# Patient Record
Sex: Male | Born: 1950 | Race: White | Hispanic: No | Marital: Married | Smoking: Former smoker
Health system: Southern US, Community
[De-identification: ages and names within clinical notes are randomized; demographics above are authoritative.]

## PROBLEM LIST (undated history)

## (undated) DIAGNOSIS — R37 Sexual dysfunction, unspecified: Secondary | ICD-10-CM

## (undated) DIAGNOSIS — J449 Chronic obstructive pulmonary disease, unspecified: Secondary | ICD-10-CM

## (undated) DIAGNOSIS — Z9981 Dependence on supplemental oxygen: Secondary | ICD-10-CM

## (undated) DIAGNOSIS — R067 Sneezing: Secondary | ICD-10-CM

## (undated) DIAGNOSIS — T8182XA Emphysema (subcutaneous) resulting from a procedure, initial encounter: Secondary | ICD-10-CM

## (undated) DIAGNOSIS — J439 Emphysema, unspecified: Secondary | ICD-10-CM

## (undated) DIAGNOSIS — I2510 Atherosclerotic heart disease of native coronary artery without angina pectoris: Secondary | ICD-10-CM

## (undated) DIAGNOSIS — J9610 Chronic respiratory failure, unspecified whether with hypoxia or hypercapnia: Secondary | ICD-10-CM

## (undated) DIAGNOSIS — I447 Left bundle-branch block, unspecified: Secondary | ICD-10-CM

## (undated) DIAGNOSIS — Z9189 Other specified personal risk factors, not elsewhere classified: Secondary | ICD-10-CM

## (undated) HISTORY — DX: Other specified personal risk factors, not elsewhere classified: Z91.89

## (undated) HISTORY — DX: Emphysema (subcutaneous) resulting from a procedure, initial encounter: T81.82XA

## (undated) HISTORY — DX: Emphysema, unspecified: J43.9

## (undated) HISTORY — DX: Dependence on supplemental oxygen: Z99.81

## (undated) HISTORY — DX: Atherosclerotic heart disease of native coronary artery without angina pectoris: I25.10

## (undated) HISTORY — DX: Chronic obstructive pulmonary disease, unspecified: J44.9

## (undated) HISTORY — DX: Sneezing: R06.7

## (undated) HISTORY — DX: Sexual dysfunction, unspecified: R37

## (undated) HISTORY — DX: Chronic respiratory failure, unspecified whether with hypoxia or hypercapnia: J96.10

## (undated) HISTORY — DX: Left bundle-branch block, unspecified: I44.7

## (undated) HISTORY — PX: APPENDECTOMY: SHX54

---

## 2001-02-24 ENCOUNTER — Encounter: Payer: Self-pay | Admitting: Critical Care Medicine

## 2006-02-24 ENCOUNTER — Ambulatory Visit: Payer: Self-pay | Admitting: Cardiology

## 2006-05-18 ENCOUNTER — Ambulatory Visit: Payer: Self-pay | Admitting: Critical Care Medicine

## 2006-07-04 ENCOUNTER — Ambulatory Visit: Admission: RE | Admit: 2006-07-04 | Discharge: 2006-07-04 | Payer: Self-pay | Admitting: Critical Care Medicine

## 2006-07-05 ENCOUNTER — Encounter: Payer: Self-pay | Admitting: Critical Care Medicine

## 2006-07-06 ENCOUNTER — Ambulatory Visit: Payer: Self-pay | Admitting: Critical Care Medicine

## 2006-07-27 ENCOUNTER — Ambulatory Visit: Payer: Self-pay | Admitting: Critical Care Medicine

## 2006-08-02 ENCOUNTER — Ambulatory Visit: Payer: Self-pay | Admitting: Critical Care Medicine

## 2006-08-16 ENCOUNTER — Ambulatory Visit: Payer: Self-pay | Admitting: Critical Care Medicine

## 2006-12-04 ENCOUNTER — Ambulatory Visit: Payer: Self-pay | Admitting: Critical Care Medicine

## 2006-12-05 DIAGNOSIS — J449 Chronic obstructive pulmonary disease, unspecified: Secondary | ICD-10-CM

## 2007-05-29 ENCOUNTER — Ambulatory Visit: Payer: Self-pay | Admitting: Critical Care Medicine

## 2007-12-03 ENCOUNTER — Ambulatory Visit: Payer: Self-pay | Admitting: Critical Care Medicine

## 2008-06-30 ENCOUNTER — Ambulatory Visit: Payer: Self-pay | Admitting: Critical Care Medicine

## 2009-01-12 ENCOUNTER — Ambulatory Visit: Payer: Self-pay | Admitting: Critical Care Medicine

## 2009-08-03 ENCOUNTER — Ambulatory Visit: Payer: Self-pay | Admitting: Critical Care Medicine

## 2009-08-18 ENCOUNTER — Telehealth (INDEPENDENT_AMBULATORY_CARE_PROVIDER_SITE_OTHER): Payer: Self-pay | Admitting: *Deleted

## 2009-10-12 ENCOUNTER — Telehealth: Payer: Self-pay | Admitting: Critical Care Medicine

## 2010-01-25 ENCOUNTER — Telehealth (INDEPENDENT_AMBULATORY_CARE_PROVIDER_SITE_OTHER): Payer: Self-pay | Admitting: *Deleted

## 2010-01-25 ENCOUNTER — Ambulatory Visit: Payer: Self-pay | Admitting: Critical Care Medicine

## 2010-02-16 ENCOUNTER — Ambulatory Visit: Payer: Self-pay | Admitting: Critical Care Medicine

## 2010-04-13 NOTE — Assessment & Plan Note (Signed)
Summary: breathing problem/ mbw   Primary Provider/Referring Provider:  none  CC:  Follow up, pt was given Spiriva by Dr. Delford Field , took for 2 months stated it helped but caused dry mouth and too expensive for him to buy, and started back on qvar but has increase sob would like other options for inhaler.  History of Present Illness: Pulmonary OV  This is  a 60 year old, white male with asthmatic bronchitis, chronic obstructive lung disease.     Aug 03, 2009 9:35 AM No real cough,  no real change in dyspnea.   Overall doing ok.  Is only on Qvar daily.  No excess mucous.  No chest pain.  No wheeze.   January 25, 2010--Presents for follow up. Last visit was started on Spiriva however did not take for long. Complains that he could not tolerate due lots of side effects w/ dry mouth, restless, also was very expensive.  We talked about several options,He wants to keep cost down. Says he felt better with he breathing on Spiriva however did not lke the side effects or cost. Talked with him about  Atrovent and he would like to try this. Denies chest pain,  orthopnea, hemoptysis, fever, n/v/d, edema, headache.   Preventive Screening-Counseling & Management  Alcohol-Tobacco     Smoking Status: quit > 6 months     Packs/Day: 2.0     Year Started: 1968     Year Quit: Jan 2008     Pack years: 66  Current Medications (verified): 1)  Adult Aspirin Low Strength 81 Mg  Tbdp (Aspirin) .... One By Mouth Once Daily 2)  Qvar 40 Mcg/act Aers (Beclomethasone Dipropionate) .... Inhale 2 Puffs Two Times A Day  Allergies (verified): 1)  ! 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 Delford Field , took for 2 months stated it helped but caused dry mouth and too expensive for him to buy, started back on qvar but has increase sob would like other options for inhaler Comments Medications reviewed with patient Renold Genta RCP, LPN  January 25, 2010 11:30 AM    Physical Exam  Additional Exam:  Gen: Pleasant, well nourished, in no distress ENT: no lesions, no postnasal drip Neck: No JVD, no TMG, no carotid bruits Lungs: no use of accessory muscles, no dullness to percussion, clear without rales or rhonchi Cardiovascular: RRR, heart sounds normal, no murmurs or gallops, no peripheral edema Musculoskeletal: No deformities, no cyanosis or clubbing     Impression & Recommendations:  Problem # 1:  COPD (ICD-496) Trial  of Atrovent  Plan Finish QVAR inhaler Begin Atrovent 2 puffs four times a day , brush, rinse and gargle after all inhaler use.  follow up Dr. Delford Field in 3 weeks   Medications Added to Medication List This Visit: 1)  Atrovent Hfa 17 Mcg/act Aers (Ipratropium bromide hfa) .... 2 puffs four times a day  Complete Medication List: 1)  Adult Aspirin Low Strength 81 Mg Tbdp (Aspirin) .... One by mouth once daily 2)  Atrovent Hfa 17 Mcg/act Aers (Ipratropium bromide hfa) .... 2 puffs four times a day  Other Orders: Est. Patient Level III (21308)  Patient Instructions: 1)  Finish QVAR inhaler 2)  Begin Atrovent 2 puffs four times a day , brush, rinse and gargle after all inhaler use.  3)  follow up Dr. Delford Field in 3 weeks  Prescriptions: ATROVENT HFA 17 MCG/ACT AERS (IPRATROPIUM  BROMIDE HFA) 2 puffs four times a day  #1 x 5   Entered and Authorized by:   Rubye Oaks NP   Signed by:   Tammy Parrett NP on 01/25/2010   Method used:   Print then Give to Patient   RxID:   [id number]

## 2010-04-13 NOTE — Progress Notes (Signed)
Summary: NEEDS 3 WK F/U   Phone Note Call from Patient Call back at [facility] Phone [phone]   Caller: Patient Call For: WRIGHT Summary of Call: NEEDS 3 WK F/U W/ DR WRIGHT. I GAVE THEM 12/5 APPT BUT PT CANCELLED AS HIS SPOUSE HAS AN APPT THAT DAY. CALL HOME # ABOVE OR [phone] Initial call taken by: Tivis Ringer, CNA,  January 25, 2010 12:20 PM  Follow-up for Phone Call        ATC pt's home number _ [facility] Gweneth Dimitri RN  January 25, 2010 5:24 PM  Wife, Asher Muir, returning call. Lehman Prom  January 26, 2010 8:33 AM  Additional Follow-up for Phone Call Additional follow up Details #1::         [facility] pt's home number -[email or web address].  Called alternate number listed above but was busy - looks like 3 wk f/u on Dec 5 was not canceled.  ? if they are wanting to reschedule this.  If so, openings on Dec 6.  Gweneth Dimitri RN  January 26, 2010 11:13 AM  Returning  call, can be reached at [phone] or [phone].Darletta Moll  January 26, 2010 11:27 AM      Additional Follow-up for Phone Call Additional follow up Details #2::    Spoke with pt's wife.  She states all they need to do is cancel the appt on dec 5 and reschedule this.  Dec 5 appt was cancelled and rescheduled for Dec 6 at 9am with PW.  Asher Muir is aware. Follow-up by: Gweneth Dimitri RN,  January 26, 2010 12:09 PM

## 2010-04-13 NOTE — Assessment & Plan Note (Signed)
Summary: Pulmonary OV   Primary Provider/Referring Provider:  none  CC:  3 wk followup.  Pt states that since starting atrovent his breathing has been "okay"- but not doing as well as when he was on spiriva.  He states no complaints today.Russell Schaefer  History of Present Illness: Pulmonary OV  This is  a 60 year old, white male with asthmatic bronchitis, chronic obstructive lung disease.     Aug 03, 2009 9:35 AM No real cough,  no real change in dyspnea.   Overall doing ok.  Is only on Qvar daily.  No excess mucous.  No chest pain.  No wheeze.   January 25, 2010--Presents for follow up. Last visit was started on Spiriva however did not take for long. Complains that he could not tolerate due lots of side effects w/ dry mouth, restless, also was very expensive.  We talked about several options,He wants to keep cost down. Says he felt better with he breathing on Spiriva however did not lke the side effects or cost. Talked with him about  Atrovent and he would like to try this. Denies chest pain,  orthopnea, hemoptysis, fever, n/v/d, edema, headache.   February 16, 2010 9:06 AM Pt seen per NP and changed from spiriva>>>qvar>>>atrovent for $$$ and side effects issues. Had insomnia with spiriva.  Dry mouth was the issues.  The pt notes atrovent does not dry out the mouth. cough is minimal.  Pt is worse on damp days. overall atrovent seemed to help.   Clinical Reports Reviewed:  PFT's:  08/03/2009: FEV1 %Predicted:  49.40 FEV1/FVC %Predicted:  65.90 FVC %Predicted:  75.10  05/29/2007: DLCO %Predicted:  54 FEF 25/75 %Predicted:  17 FEV1 %Predicted:  47 FVC %Predicted:  81 Post Spirometry FEF 25/75 %Predicted:  15 Post Spirometry FEV1 %Predicted:  49 Post Spirometry FVC %Predicted:  90 RV %Predicted:  145 TLC %Predicted:  104   Current Medications (verified): 1)  Adult Aspirin Low Strength 81 Mg  Tbdp (Aspirin) .... One By Mouth Once Daily 2)  Atrovent Hfa 17 Mcg/act Aers (Ipratropium  Bromide Hfa) .... 2 Puffs Four Times A Day  Allergies (verified): 1)  ! * Spiriva  Past History:  Past medical, surgical, family and social histories (including risk factors) reviewed, and no changes noted (except as noted below).  Past Medical History: Reviewed history from 12/05/2006 and no changes required. COPD  Past Pulmonary History:  Pulmonary History: COPD  Golds Stage II  PFTS 2008: FeV1 47%  FeV1/FVC 46%  TLC 102% DLCO 54%    Family History: Reviewed history from 05/29/2007 and no changes required. Family History COPD   Social History: Reviewed history from 05/29/2007 and no changes required. 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  The patient denies shortness of breath at rest, productive cough, non-productive cough, coughing up blood, chest pain, irregular heartbeats, acid heartburn, indigestion, loss of appetite, weight change, abdominal pain, difficulty swallowing, sore throat, tooth/dental problems, headaches, nasal congestion/difficulty breathing through nose, sneezing, itching, ear ache, anxiety, depression, hand/feet swelling, joint stiffness or pain, rash, change in color of mucus, and fever.    Vital Signs:  Patient profile:   60 year old male Weight:      162.25 pounds O2 Sat:      90 % on Room air Temp:     97.5 degrees F oral Pulse rate:   70 / minute  BP sitting:   118 / 80  (left arm)  Vitals Entered By: Vernie Murders (February 16, 2010 8:59 AM)  O2 Flow:  Room air  O2 Sat Comments when the pt arrived o2 sat was 86% RA- this increased to 90% RA after resting  Physical Exam  Additional Exam:  Gen: Pleasant, well nourished, in no distress ENT: no lesions, no postnasal drip Neck: No JVD, no TMG, no carotid bruits Lungs: no use of accessory muscles, no dullness to percussion, clear without rales or rhonchi Cardiovascular: RRR, heart sounds  normal, no murmurs or gallops, no peripheral edema Musculoskeletal: No deformities, no cyanosis or clubbing     Impression & Recommendations:  Problem # 1:  COPD (ICD-496) Assessment Improved golds stage II copd , side effects with spiriva plan dc spiriva cont atrovent qid  dc qvar   Complete Medication List: 1)  Adult Aspirin Low Strength 81 Mg Tbdp (Aspirin) .... One by mouth once daily 2)  Atrovent Hfa 17 Mcg/act Aers (Ipratropium bromide hfa) .... 2 puffs four times a day  Other Orders: Est. Patient Level III (78469)  Patient Instructions: 1)  Stay on Atrovent for now 2)  Return 4-5 months   Contraindications/Deferment of Procedures/Staging:    Test/Procedure: FLU VAX    Reason for deferment: patient declined   Prevention & Chronic Care Immunizations   Influenza vaccine: Not documented   Influenza vaccine deferral: patient declined  (02/16/2010)    Tetanus booster: Not documented    Pneumococcal vaccine: Not documented  Colorectal Screening   Hemoccult: Not documented    Colonoscopy: Not documented  Other Screening   PSA: Not documented   Smoking status: quit > 6 months  (01/25/2010)  Lipids   Total Cholesterol: Not documented   LDL: Not documented   LDL Direct: Not documented   HDL: Not documented   Triglycerides: Not documented

## 2010-04-13 NOTE — Assessment & Plan Note (Signed)
Summary: Pulmonary OV   Primary Provider/Referring Provider:  none  CC:  6 month followup.  Pt states that breathing is the same- no better or worse.  He denies any new complaints today.Marland Kitchen  History of Present Illness: Pulmonary OV  This is  a 60 year old, white male with asthmatic bronchitis, chronic obstructive lung disease.     Aug 03, 2009 9:35 AM No real cough,  no real change in dyspnea.   Overall doing ok.  Is only on Qvar daily.  No excess mucous.  No chest pain.  No wheeze. Pt denies any significant sore throat, nasal congestion or excess secretions, fever, chills, sweats, unintended weight loss, pleurtic or exertional chest pain, orthopnea PND, or leg swelling Pt denies any increase in rescue therapy over baseline, denies waking up needing it or having any early am or nocturnal exacerbations of coughing/wheezing/or dyspnea. Pt also denies any obvious fluctuation in symptoms wiht weather or environmental change or other alleviating or aggravating factors    Preventive Screening-Counseling & Management  Alcohol-Tobacco     Smoking Status: quit > 6 months     Year Quit: Jan 2008  Current Medications (verified): 1)  Adult Aspirin Low Strength 81 Mg  Tbdp (Aspirin) .... One By Mouth Once Daily 2)  Qvar 40 Mcg/act Aers (Beclomethasone Dipropionate) .... Two Puffs Once Daily  Allergies (verified): No Known Drug Allergies  Past History:  Past medical, surgical, family and social histories (including risk factors) reviewed, and no changes noted (except as noted below).  Past Medical History: Reviewed history from 12/05/2006 and no changes required. COPD  Past Pulmonary History:  Pulmonary History: COPD  Golds Stage II  PFTS 2008: FeV1 47%  FeV1/FVC 46%  TLC 102% DLCO 54%    Family History: Reviewed history from 05/29/2007 and no changes required. Family History COPD   Social History: Reviewed history from 05/29/2007 and no changes required. 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  The patient denies shortness of breath at rest, productive cough, non-productive cough, coughing up blood, chest pain, irregular heartbeats, acid heartburn, indigestion, loss of appetite, weight change, abdominal pain, difficulty swallowing, sore throat, tooth/dental problems, headaches, nasal congestion/difficulty breathing through nose, sneezing, itching, ear ache, anxiety, depression, hand/feet swelling, joint stiffness or pain, rash, change in color of mucus, and fever.    Vital Signs:  Patient profile:   60 year old male Weight:      160 pounds O2 Sat:      93 % on Room air Temp:     97.7 degrees F oral Pulse rate:   76 / minute BP sitting:   126 / 82  (left arm)  Vitals Entered By: Vernie Murders (Aug 03, 2009 9:30 AM)  O2 Flow:  Room air  Physical Exam  Additional Exam:  Gen: Pleasant, well nourished, in no distress ENT: no lesions, no postnasal drip Neck: No JVD, no TMG, no carotid bruits Lungs: no use of accessory muscles, no dullness to percussion, clear without rales or rhonchi Cardiovascular: RRR, heart sounds normal, no murmurs or gallops, no peripheral edema Musculoskeletal: No deformities, no cyanosis or clubbing     Pulmonary Function Test Date: 08/03/2009 09:44 AM Gender: Male  Pre-Spirometry FVC    Value: 3.47 L/min   % Pred: 75.10 % FEV1    Value: 1.73 L     Pred: 3.50 L     %  Pred: 49.40 % FEV1/FVC  Value: 49.97 %     % Pred: 65.90 %  Impression & Recommendations:  Problem # 1:  COPD (ICD-496) Assessment Unchanged  Chronic obstructive lung disease, stable at this time. No change in airflow obstruction on spirometry today 5/23  golds stage II  Plan d/c qvar start spiriva daily  Medications Added to Medication List This Visit: 1)  Spiriva Handihaler 18 Mcg Caps (Tiotropium bromide monohydrate) .... Two puffs in handihaler  daily  Complete Medication List: 1)  Adult Aspirin Low Strength 81 Mg Tbdp (Aspirin) .... One by mouth once daily 2)  Spiriva Handihaler 18 Mcg Caps (Tiotropium bromide monohydrate) .... Two puffs in handihaler daily  Other Orders: Spirometry w/Graph (94010) Est. Patient Level IV (18841) Prescription Created Electronically [phone]) HFA Instruction [phone])  Patient Instructions: 1)  Stop Qvar 2)  Start Spiriva ,  one capsule, two puffs daily, use sample, refills sent to your pharmacy 3)  Return 6 months, sooner if unimproved or worse Prescriptions: SPIRIVA HANDIHALER 18 MCG  CAPS (TIOTROPIUM BROMIDE MONOHYDRATE) Two puffs in handihaler daily Brand medically necessary #30 x 6   Entered and Authorized by:   Storm Frisk MD   Signed by:   Storm Frisk MD on 08/03/2009   Method used:   Electronically to        [facility]* (retail)       [address]       [place], [place]  [id number]       Ph: [phone]       Fax: [phone]   RxID:   [id number]    CardioPerfect Spirometry  ID: [id number] Patient: MATSON, WELCH DOB: [birth year] Age: 60 Years Old Sex: Male Race: White Height: 69 Weight: 160 Status: Confirmed Past Medical History:  COPD  Recorded: 08/03/2009 09:44 AM  Parameter  Measured Predicted %Predicted FVC     3.47        4.62        75.10 FEV1     1.73        3.50        49.40 FEV1%   49.97        75.88        65.90 PEF    5.07        9.05        56   Comments: Severe obstructive defect, little change from 2008.   Interpretation: Pre: FVC= 3.47L FEV1= 1.73L FEV1%= 50.0% 1.73/3.47 FEV1/FVC (08/03/2009 9:49:01 AM), Severe obstruction.Mild restriction

## 2010-04-13 NOTE — Progress Notes (Signed)
Summary: samples  Phone Note Call from Patient Call back at Home Phone [phone]   Caller: Patient Call For: wright Summary of Call: pt states that dr Delford Field gave him a 10 day sample of spiriva. however, pt is to take this for 3 wks. he wants samples for this. call home # or [phone] Initial call taken by: Tivis Ringer, CNA,  August 18, 2009 8:54 AM  Follow-up for Phone Call        2 boxes of spiriva lft out front for pt--pt also aware refills were sent to pharmacy  lmom for pt with this info Follow-up by: Philipp Deputy CMA,  August 18, 2009 10:38 AM

## 2010-04-13 NOTE — Progress Notes (Signed)
Summary: medication issues-pt returned call  Phone Note Call from Patient Call back at [facility] Phone [phone] Call back at Work Phone [phone]   Caller: Patient Call For: wright Reason for Call: Talk to Nurse Summary of Call: Pt states the spiriva causes his mouth to be dry and it's too expensive, wants a substitute, pls advise. community pharmacy  Initial call taken by: Darletta Moll,  October 12, 2009 1:26 PM  Follow-up for Phone Call        Spoke with pt.  He was started on spiriva at last ov in May 2011.  He feels like this has really helped his breathing, but he has had " constant cotton mouth"- wakes him up in the night with dry mouth and also he states that he is unable to afford spiriva, 200$ per month. Would like to try something else.  Will forward to doc of the day. Pls advise thanks! Follow-up by: Vernie Murders,  October 12, 2009 3:09 PM  Additional Follow-up for Phone Call Additional follow up Details #1::        We can d/c the spiriva for now, follow up with Dr Delford Field regarding restarting an alternative. If breathing worsens off spiriva he needs to call to let us know. Leslye Peer MD  October 12, 2009 5:37 PM  Additional Follow-up by: Leslye Peer MD,  October 12, 2009 5:37 PM   New Allergies: ! [facility] Additional Follow-up for Phone Call Additional follow up Details #2::    [email or web address] Randell Loop CMA  October 12, 2009 5:39 PM  PT RETURNED CALL FROM TRIAGE. CALL HOME # OR [phone]. Tivis Ringer, CNA  October 13, 2009 9:00 AM  spoke with patient he is aware to stop spiriva for now to see if symptoms improve, pt states he had already stopped the spiriva 2 days ago. also advised pt  if his symptoms do not improve or worsen to call us right away for appt or go to the closest er if after hours, pt aware pw not back until thursday and he states that he has been using the qvar he was on before and will continue this until he hears from pw on thursday. pt refused appt  with anyone for now,  wants to wait to see what pw's recs. are first.    Follow-up by: Philipp Deputy CMA,  October 13, 2009 9:38 AM  Additional Follow-up for Phone Call Additional follow up Details #3:: Details for Additional Follow-up Action Taken: stop spiriva ok Qvar ok two puff twice daily Additional Follow-up by: Storm Frisk MD,  October 14, 2009 8:37 AM  New Allergies: ! * SPIRIVA  Appended Document: medication issues-pt returned call Medications Added QVAR 40 MCG/ACT AERS (BECLOMETHASONE DIPROPIONATE) Inhale 2 puffs two times a day       Called, spoke with pt.  Pt advised per PW, ok to stop spiriva and to do qvar two puffs two times a day.  Advised pt if breathing does not improve on qvar or gets worse to call offce back.  He verbalized understanding of instructions.    Clinical Lists Changes  Medications: Changed medication from SPIRIVA HANDIHALER 18 MCG  CAPS (TIOTROPIUM BROMIDE MONOHYDRATE) Two puffs in handihaler daily [BMN] to QVAR 40 MCG/ACT AERS (BECLOMETHASONE DIPROPIONATE) Inhale 2 puffs two times a day

## 2010-07-14 ENCOUNTER — Encounter: Payer: Self-pay | Admitting: Critical Care Medicine

## 2010-07-16 ENCOUNTER — Encounter: Payer: Self-pay | Admitting: Critical Care Medicine

## 2010-07-16 ENCOUNTER — Ambulatory Visit (INDEPENDENT_AMBULATORY_CARE_PROVIDER_SITE_OTHER): Payer: PRIVATE HEALTH INSURANCE | Admitting: Critical Care Medicine

## 2010-07-16 VITALS — BP 118/78 | HR 71 | Temp 97.9°F | Ht 69.0 in | Wt 161.4 lb

## 2010-07-16 DIAGNOSIS — J449 Chronic obstructive pulmonary disease, unspecified: Secondary | ICD-10-CM

## 2010-07-16 DIAGNOSIS — J4489 Other specified chronic obstructive pulmonary disease: Secondary | ICD-10-CM

## 2010-07-16 MED ORDER — IPRATROPIUM BROMIDE HFA 17 MCG/ACT IN AERS: 2.0000 | INHALATION_SPRAY | Freq: Three times a day (TID) | RESPIRATORY_TRACT | Status: DC

## 2010-07-16 NOTE — Progress Notes (Signed)
Subjective:    Patient ID: Russell Schaefer, male    DOB: 1951-03-07, 60 y.o.   MRN: [id number]  HPI 60 y.o. WM with Golds Stage III copd FeV1 50% prd 07/16/2010 Worse on rainy wet days.  Dyspnea is unchanged.  Not much cough.  No real chest pain.  No edema.    Past Medical History  Diagnosis Date  . COPD (chronic obstructive pulmonary disease)      Family History  Problem Relation Age of Onset  . COPD       History   Social History  . Marital Status: Married    Spouse Name: N/A    Number of Children: N/A  . Years of Education: N/A   Occupational History  . Garment/textile technologist    Social History Main Topics  . Smoking status: Former Smoker -- 2.0 packs/day for 39 years    Types: Cigarettes    Quit date: 03/14/2006  . Smokeless tobacco: Never Used  . Alcohol Use: Not on file  . Drug Use: Not on file  . Sexually Active: Not on file   Other Topics Concern  . Not on file   Social History Narrative  . No narrative on file     No Known Allergies   Outpatient Prescriptions Prior to Visit  Medication Sig Dispense Refill  . aspirin 81 MG tablet Take 81 mg by mouth daily.        Marland Kitchen ipratropium (ATROVENT HFA) 17 MCG/ACT inhaler Inhale 2 puffs into the lungs 3 (three) times daily.          Review of Systems Constitutional:   No  weight loss, night sweats,  Fevers, chills, fatigue, lassitude. HEENT:   No headaches,  Difficulty swallowing,  Tooth/dental problems,  Sore throat,                No sneezing, itching, ear ache, nasal congestion, post nasal drip,   CV:  No chest pain,  Orthopnea, PND, swelling in lower extremities, anasarca, dizziness, palpitations  GI  No heartburn, indigestion, abdominal pain, nausea, vomiting, diarrhea, change in bowel habits, loss of appetite  Resp: Notes shortness of breath with exertion not at rest.  No excess mucus, no productive cough,  No non-productive cough,  No coughing up of blood.  No change in color of mucus.  No wheezing.  No  chest wall deformity  Skin: no rash or lesions.  GU: no dysuria, change in color of urine, no urgency or frequency.  No flank pain.  MS:  No joint pain or swelling.  No decreased range of motion.  No back pain.  Psych:  No change in mood or affect. No depression or anxiety.  No memory loss.     Objective:   Physical Exam Filed Vitals:   07/16/10 1032  BP: 118/78  Pulse: 71  Temp: 97.9 F (36.6 C)  TempSrc: Oral  Height: 5\' 9"  (1.753 m)  Weight: 161 lb 6.4 oz (73.211 kg)  SpO2: 94%    Gen: Pleasant, well-nourished, in no distress,  normal affect  ENT: No lesions,  mouth clear,  oropharynx clear, no postnasal drip  Neck: No JVD, no TMG, no carotid bruits  Lungs: No use of accessory muscles, no dullness to percussion, distant BS Cardiovascular: RRR, heart sounds normal, no murmur or gallops, no peripheral edema  Abdomen: soft and NT, no HSM,  BS normal  Musculoskeletal: No deformities, no cyanosis or clubbing  Neuro: alert, non focal  Skin: Warm, no  lesions or rashes      PFT Conversion 08/03/2009  FVC 3.47  FVC PREDICT   FVC  % Predicted 75.1  FEV1 1.73  FEV1 PREDICT 3.50  FEV % Predicted 49.4  FEV1/FVC 49.9  FEV1/FVC PRE   FEV1/FVC%EXP 65.90  FeF 25-75   FeF 25-75 % Predicted   FEF % EXPEC   POST FVC   FVCPRDPST   POST FVC%EXP   POST FEV1   FEV1PRDPST   POSTFEV1%PRD   PSTFEV1/FVC   FEV1FVCPRDPS   PSTFEF25/75%   PSTFEF25/75P   FEF2575%EXPS   Residual Volume (ml)   RV % EXPECT   DLCO (ml/mmHg sec)   DLCO % EXPEC   DLCO/VA   DLCO/VA%EXP   PEF % EXPECT 9.05  PFT RSLT     Assessment & Plan:   COPD Stable Copd Golds stage III Plan Cont atrovent on schedule     Updated Medication List Outpatient Encounter Prescriptions as of 07/16/2010  Medication Sig Dispense Refill  . aspirin 81 MG tablet Take 81 mg by mouth daily.        . Calcium Carbonate-Vit D-Min (CALCIUM 1200 PO) Take 1 tablet by mouth daily.        Marland Kitchen ipratropium (ATROVENT HFA)  17 MCG/ACT inhaler Inhale 2 puffs into the lungs 3 (three) times daily.  1 Inhaler  11  . Multiple Vitamin (MULTIVITAMIN) tablet Take 1 tablet by mouth daily.        Marland Kitchen DISCONTD: ipratropium (ATROVENT HFA) 17 MCG/ACT inhaler Inhale 2 puffs into the lungs 3 (three) times daily.

## 2010-07-16 NOTE — Assessment & Plan Note (Signed)
Stable Copd Golds stage III Plan Cont atrovent on schedule

## 2010-07-16 NOTE — Patient Instructions (Signed)
No change in medications. Return in        6 months        

## 2010-07-27 NOTE — Assessment & Plan Note (Signed)
[facility]                             PULMONARY OFFICE NOTE   MAKARIOS, MADLOCK                      MRN:          [id number]  DATE:07/27/2006                            DOB:          10-09-1950    Mr. Russell Schaefer is a 60 year old white male with history of asthmatic  bronchitis, chronic obstructive lung disease, primary emphysema. He had  been on Spiriva, but did not tolerate this. We switched him over to  Advair 100/50 one spray b.i.d. at the end of April. Since then, his  breathing has improved, but he is now having irritation on the tongue  and hoarseness. Symptoms are worse since starting the Advair. He did  finish his course of prednisone as prescribed. His other maintenance  medications are unchanged in the chart and listed and are correct as  reviewed.   PHYSICAL EXAMINATION:  Temperature 97.0, blood pressure 110/60, pulse  88, saturation 91% room air.  CHEST: Showed to be clear with improved airflow.  CARDIAC: Showed a regular rate and rhythm without S3. Normal S1, S2.  ABDOMEN: Soft, nontender.  EXTREMITIES: Showed no edema or clubbing or venous disease.  SKIN: Was clear.  NEUROLOGIC: Was intact.  HEENT: Showed no jugular venous distention. No lymphadenopathy.  Oropharynx was clear.  NECK:  Supple.   IMPRESSION:  Would be that of chronic obstructive lung disease with  adverse reaction to Advair.   PLAN:  Is to hold Advair for one week and begin Magic Mouthwash t.i.d.  15 cc swish, gargle and expectorate. The patient will restart Advair  back in one weeks time. If symptoms recur, we may need to look at a  different product for this patient.     Russell Cradle Delford Field, MD, [facility]  Electronically Signed    PEW/MedQ  DD: 07/27/2006  DT: 07/27/2006  Job #: [id number]   cc:   Thomas C. Wall, MD, [facility]

## 2010-07-27 NOTE — Assessment & Plan Note (Signed)
[facility]                             PULMONARY OFFICE NOTE   Russell Schaefer, Russell Schaefer                      MRN:          [id number]  DATE:08/16/2006                            DOB:          [birth year]    Russell Schaefer is seen today in return followup.  This is a 60 year old  white male with asthmatic bronchitis.  Overall he is improved.  Now on  Foradil 1 spray twice daily and Q-Var 40 mcg strength 2 sprays b.i.d.  Does complain of heartburn and abdominal pain.  He is not on any type of  antacid.   ON EXAM:  VITAL SIGNS:  Temperature 98.  Blood pressure 110/74.  Pulse  93.  O2 saturation 91% on room air.  CHEST:  Showed distant breath sounds with prolonged expiratory phase.  No wheeze or rhonchi were noted.  CARDIAC EXAM:  Showed a regular rate and rhythm without S3.  Normal S1,  S2.  ABDOMEN:  Soft, nontender.  EXTREMITIES:  Showed no edema or clubbing.  SKIN:  Clear.   IMPRESSION:  Asthmatic bronchitis, chronic obstructive lung disease,  positive response to inhaled medications.   PLAN:  The patient is to continue Foradil and Q-Var as prescribed.  He  will begin Protonix 40 mg daily for reflux and was given the reflux diet  sheet.  We will see the patient back in followup in six weeks.     Charlcie Cradle Delford Field, MD, [facility]  Electronically Signed    PEW/MedQ  DD: 08/16/2006  DT: 08/16/2006  Job #: [id number]   cc:   Thomas C. Wall, MD, [facility]

## 2010-07-27 NOTE — Assessment & Plan Note (Signed)
[facility]                             PULMONARY OFFICE NOTE   FONTAINE, HEHL                      MRN:          [id number]  DATE:12/04/2006                            DOB:          1950/05/21    Mr. Russell Schaefer returns in followup. He is a 60 year old white male with  asthmatic bronchitis, ex-smoker for 8 months. He is doing well now on  Qvar and Foradil. He has cycled off of Protonix.   PHYSICAL EXAMINATION:  Temperature 97.0, blood pressure 104/78, pulse  83, saturation is 91% on room air.  CHEST: Showed improved breath sounds with decreased wheezing.  CARDIAC: Regular rate and rhythm without S3. Normal S1, S2.  ABDOMEN: Soft and nontender.  EXTREMITIES: Showed no edema or clubbing.  SKIN: Clear.   IMPRESSION:  Chronic lung disease with improved airflow function.   PLAN:  Is to wean off of Foradil and maintain his Qvar two sprays b.i.d.  40 mcg strength and after four weeks of period of time reduce Qvar to  two sprays daily. Will see the patient back in followup in four months.     Charlcie Cradle Delford Field, MD, [facility]  Electronically Signed    PEW/MedQ  DD: 12/04/2006  DT: 12/04/2006  Job #: [id number]   cc:   Jesse Sans. Wall, MD, [facility]

## 2010-07-27 NOTE — Assessment & Plan Note (Signed)
[facility]                             PULMONARY OFFICE NOTE   Russell Schaefer, Russell Schaefer                      MRN:          [id number]  DATE:08/02/2006                            DOB:          [birth year]    Russell Schaefer is a 60 year old white male, history of chronic  obstructive lung disease, asthmatic bronchitic, and emphysematous  components.  He has had difficulty with adverse reactions to a variety  of DPI products, including Spiriva, and then more recently Advair.  With  the Advair, he had significant mouth irritation, but he did have  significant improvement in his dyspnea with the Advair.  Since being off  this for a week, his dyspnea has returned with a vengeance.  He has had  increased shortness of breath at time, hot and cold flashes, and  headaches.  He is using the Magic Mouthwash for his oral hygiene, and  this is improved.   EXAMINATION:  Temperature 98.  Blood pressure 130/88.  Pulse 86.  Saturation 90% on room air.  CHEST:  Showed distant breath sounds with prolonged expiratory phase.  No wheeze or rhonchi were noted.  CARDIAC EXAM:  Showed a regular rate and rhythm without S3.  Normal S1  and S2.  ABDOMEN:  Soft, protuberant.  EXTREMITIES:  Showed no edema or clubbing.  SKIN:  Clear.   IMPRESSION:  Is that of chronic obstructive lung disease with mild  flare.   PLAN:  For the patient is to receive Foradil 1 capsule and Aerolizer  b.i.d., and also to receive the Qvar 40 mcg strength 2 sprays b.i.d.  We  will see the patient back in return followup in 2 weeks' time to  reassess response to therapy.  He was instructed as to the proper use of  these inhaled medications.  If he does not respond well to these  treatments, we may need to consider aerosolized and nebulized  medications.     Russell Cradle Delford Field, MD, [facility]  Electronically Signed    PEW/MedQ  DD: 08/02/2006  DT: 08/02/2006  Job #: [id number]   cc:   Thomas C. Wall, MD,  [facility]

## 2010-07-30 NOTE — Assessment & Plan Note (Signed)
[facility]                             PULMONARY OFFICE NOTE   NAME:Russell Schaefer, Russell Schaefer                      MRN:          [id number]  DATE:07/06/2006                            DOB:          December 04, 1950    Mr. Breck is a 60 year old white male, history of chronic  obstructive lung disease, primary emphysematous component.  We saw the  patient last on March the 6th, prescribed Spiriva, he did well for  several weeks, but then became more short of breath and had adverse  reactions to the Spiriva.  He stopped this on his own.  He is back in  today for a pulmonary function followup.  Pulmonary functions have been  obtained and showed FEV1 of 47% predicted, FVC of 81% predicted, and  total lung capacity of 104% predicted, diffusion capacity 54% predicted.   PHYSICAL EXAMINATION:  VITAL SIGNS:  Temp 97, blood pressure 120/80,  pulse 96, saturation 92% room air.  CHEST:  Distant breath sounds with prolonged expiratory phase, no wheeze  or rhonchi noted.  CARDIAC:  Regular rate and rhythm without S3, a normal S1 S2.  ABDOMEN:  Soft and nontender.  EXTREMITIES:  No edema, clubbing, or venous disease.   IMPRESSION:  Chronic obstructive pulmonary disease with a primary  emphysematous component.   PLAN:  Begin Advair 250/50 one spray twice daily and to maintain inhaled  medicines as currently dosed, and we will see the patient back in return  followup.     Charlcie Cradle Delford Field, MD, [facility]  Electronically Signed    PEW/MedQ  DD: 07/06/2006  DT: 07/06/2006  Job #: [id number]   cc:   Jesse Sans. Wall, MD, [facility]

## 2010-07-30 NOTE — Assessment & Plan Note (Signed)
[facility]                            CARDIOLOGY OFFICE NOTE   Russell Schaefer                      MRN:          [id number]  DATE:02/24/2006                            DOB:          1951-01-10    Russell Schaefer is a delightful, 60 year old, married white male from [place],  [place], who is self-referred for progressive dyspnea on exertion  and dizzy spells.  He has not seen a doctor in years.   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   Over the last 6 months or so, he has had progressive shortness of breath  to the point that he has to stop at some junctures.  This is  particularly true when he is in a hurry, or if he is walking in the  woods hunting.   He also gives a history over the last 4 years of having intermittent  dizzy spells associated with nausea.  He has never blacked out  completely.  On careful history, he has no specific neurological  complaints with that.   He denies any symptoms of angina or ischemia, otherwise.  He says no  tachy palpitations.  He denies orthopnea, PND, or peripheral edema.   He does have a chronic cough, and on occasion he will blow some blood-  tinged mucous from his nose.  He has not had any gross hemoptysis.   PAST MEDICAL HISTORY:  He has no known dye reactions, no allergies to  medications.  He is on no medications.  He does not drink, he does not  use any recreational drugs.  He does not drink any caffeinated  beverages.  He has not had blood work done in years.   SURGICAL HISTORY:  Appendectomy in 1955.   FAMILY HISTORY:  He is negative for premature heart disease.  His father  did die of emphysema at age 27.   SOCIAL HISTORY:  He is married and has 1 child.  He has a couple of  grandchildren.  He is self-employed in the Games developer business.   REVIEW OF SYSTEMS:  He has had some headaches  which he has had for  years.  These are not associated with the dizzy spells.  He has had no  head trauma.  He denies any chronic fevers.  His weight has been  relatively stable.  HEENT:  He denies any amaurosis fugax, any visual  loss or blurring, any hearing loss.  NECK:  Supple.  He has no  tenderness, he has had no masses.  RESPIRATORY:  Per HPI. CARDIAC:  Per  HPI.  GI:  No nausea, vomiting, difficulty swallowing.  No melena or  hematochezia.  GU:  Negative. MUSCULOSKELETAL:  Negative. SKIN:  Negative.  NEUROPSYCHIATRIC: Negative.  OTHER SYSTEMS:  Negative.   PHYSICAL EXAMINATION:  He is 5 foot 9 inches, weighs 140 pounds.  His  blood pressure lying down is 117/77.  Heart rate is 73.  Standing is  126/81, heart rate 82.  After 5 minutes, 130/83, heart rate is 66.  His  room air sat was initially 89%, and then after a few seconds, it went up  to 94%.  This is on room air.  His respiratory rate is 18 and unlabored.  He is afebrile.  SKIN:  Warm and dry.  Skin color is normal  HEENT:  Normocephalic, atraumatic.  PERRLA.  Extraocular movements  intact.  Sclerae clear.  He wears glasses.  Facial symmetry is normal.  Dentition is satisfactory.  Carotid upstrokes were equal bilaterally.  There is no JVD.  Thyroid is not enlarged, trachea is midline.  LUNGS:  Reveal some mild inspiratory and expiratory rhonchi.  They  change with coughing.  His PMI is non-displaced.  He has normal S1, S2.  No murmurs, rubs, or  gallops.  ABDOMEN:  Soft with good bowel sounds, no midline bruit.  There is no  hepatomegaly.  EXTREMITIES:  Show no cyanosis or edema.  There is no clubbing.  Pulses  are intact.  NEURO:  Intact.  SKIN:  Intact.  MUSCULOSKELETAL:  Intact.   EKG today is normal except for poor R wave progression across the  anterior precordium.  He has no acute changes.   Chest x-ray P and lateral shows severe hyperexpansion of the lungs with  flattening of his diaphragms.  There are no  masses, there is no  infiltrate, there is no fluid.  Cardiac silhouette looks normal.   A CBC, TSH, CMP, lipids, are pending.   ASSESSMENT:  1. Dyspnea on exertion secondary to moderate-to-severe emphysema.  2. Tobacco use.  3. Intermittent dizzy spells which are of unclear etiology by history      of exam.   RECOMMENDATIONS:  I spent about 30 minutes talking to Russell Schaefer and his wife  Asher Muir.  They both smoke and they both need to quit.  We did discuss  Chantix, which he thinks he may need.  We will start him on a starter  pack.   I will personally check on him in the next couple of weeks to see how he  is doing.  If he continues to have the dizzy spells, we can reevaluate.  If he continues to have marked dyspnea on exertion, or if not improving  with smoking cessation, we can get formal pulmonary consultation.     Thomas C. Daleen Squibb, MD, [facility]  Electronically Signed    TCW/MedQ  DD: 02/24/2006  DT: 02/24/2006  Job #: [id number]

## 2010-07-30 NOTE — Assessment & Plan Note (Signed)
[facility]                             PULMONARY OFFICE NOTE   NAME:Russell Schaefer, Russell Schaefer                      MRN:          [id number]  DATE:05/18/2006                            DOB:          10/04/1950    PULMONARY CONSULTATION   CHIEF COMPLAINT:  Evaluate dyspnea.   HISTORY OF PRESENT ILLNESS:  A 60 year old white male who has had  dyspnea for 1-and-a-half years, getting progressively worse.  He has had  minimal cough, but the cough has been improved since he stopped smoking.  He smoked 39 years, a pack a day, quit smoking in January of 2008.  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  Worse on  cold days.  Also, coughs and has dyspnea when exposed to paint fumes.  He is not short of breath at rest.  He denies any reflux symptoms.  He  had a 12-pound weight gain since December of 2007.  Notes some sneezing.  Denies any itching, earache, anxiety, or depression.  He is referred for  further evaluation.  He is on no inhaled medicines at this time and has  no pulmonary functions yet obtained.   PAST MEDICAL HISTORY:   MEDICAL:  No history of hypertension or heart disease, heart  dysrhythmias, asthma, known history of diabetes.  No history of  hypercholesterolemia, stroke, cancer, renal disease, sleep apnea, or  disorders of the central nervous system.   OPERATIVE HISTORY:  Includes appendectomy at age 53.   MEDICATION ALLERGIES:  None.   MEDICATIONS:  1. Aspirin 81 mg daily.  2. Vitamins daily.   SOCIAL HISTORY:  The patient quit smoking in January as noted above.  He  is self-employed Insurance underwriter.   FAMILY HISTORY:  Emphysema in father.  Heart disease in mother.   REVIEW OF SYSTEMS:  Otherwise, noncontributory.   PHYSICAL EXAM:  Temperature 97.5, blood pressure 138/82, pulse 89,  saturation 93%, weight 150 pounds.  GENERAL:  This is a thin  male in no distress.  CHEST:  Showed distant breath sounds.  Hyperresonance to percussion.  Prolonged expiratory phase.  CARDIAC:  Regular rate and rhythm without S3.  Normal S1, S2.  ABDOMEN:  Soft, nontender.  EXTREMITIES:  No edema, clubbing, or venous disease.  SKIN:  Clear.  NEUROLOGIC:  Intact.  HEENT:  No jugular venous distension.  No lymphadenopathy.  Oropharynx  clear.  NECK:  Supple.   LABORATORY DATA:  Chest x-ray showed COPD changes.  No acute infiltrate.   IMPRESSION:  Chronic obstructive lung disease, doubt asthmatic  bronchitic component, but just primary emphysematous components.   RECOMMENDATIONS:  Pursue full set of pulmonary function studies and  initiate Spiriva therapy 1 capsule daily.  The patient was instructed as  to the proper use of the inhaler.  The patient will return in followup  in 2 months' time with repeat pulmonary function studies at that time.     Charlcie Cradle Delford Field, MD, [facility]  Electronically Signed    PEW/MedQ  DD: 05/18/2006  DT: 05/18/2006  Job #: [id number]   cc:   Thomas C. Wall, MD, [facility]

## 2010-11-02 ENCOUNTER — Telehealth: Payer: Self-pay | Admitting: Critical Care Medicine

## 2010-11-02 NOTE — Telephone Encounter (Signed)
Pt's wife stated that it would be best to make appointment with her as she knows the schedule better.  She stated she can also be reached at [phone].  Russell Schaefer

## 2010-11-02 NOTE — Telephone Encounter (Signed)
Made appt for tomorrow.  Pt's wife stated no need to respond.  Russell Schaefer

## 2010-11-03 ENCOUNTER — Ambulatory Visit (INDEPENDENT_AMBULATORY_CARE_PROVIDER_SITE_OTHER): Payer: PRIVATE HEALTH INSURANCE | Admitting: Critical Care Medicine

## 2010-11-03 ENCOUNTER — Encounter: Payer: Self-pay | Admitting: Critical Care Medicine

## 2010-11-03 VITALS — BP 126/82 | HR 77 | Temp 97.5°F | Ht 69.0 in | Wt 165.0 lb

## 2010-11-03 DIAGNOSIS — J449 Chronic obstructive pulmonary disease, unspecified: Secondary | ICD-10-CM

## 2010-11-03 DIAGNOSIS — J441 Chronic obstructive pulmonary disease with (acute) exacerbation: Secondary | ICD-10-CM

## 2010-11-03 MED ORDER — FLUTICASONE-SALMETEROL 115-21 MCG/ACT IN AERO: 2.0000 | INHALATION_SPRAY | Freq: Two times a day (BID) | RESPIRATORY_TRACT | Status: DC

## 2010-11-03 MED ORDER — PREDNISONE 10 MG PO TABS: ORAL_TABLET | ORAL | Status: DC

## 2010-11-03 NOTE — Patient Instructions (Signed)
Stop atrovent Start Advair two puff twice daily Take prednisone 10mg  Take 4 for two days, three for two days, two for two days, then one for two days and stop No other medication changes Return 3 months

## 2010-11-03 NOTE — Progress Notes (Signed)
Subjective:    Patient ID: Russell Schaefer, male    DOB: 1950/07/01, 60 y.o.   MRN: [id number]  HPI  60 y.o. WM with Golds Stage III copd FeV1 50% prd 07/14/10 Worse on rainy wet days.  Dyspnea is unchanged.  Not much cough.  No real chest pain.  No edema.    8/22 Much worse with heat over the summer.  Notes no more cough.  No chest pain.  No real wheeze.   Mucus is white. Notes some drainage out of nose,   Dyspnea at rest and exertion. Past Medical History  Diagnosis Date  . COPD (chronic obstructive pulmonary disease)      Family History  Problem Relation Age of Onset  . COPD       History   Social History  . Marital Status: Married    Spouse Name: N/A    Number of Children: N/A  . Years of Education: N/A   Occupational History  . Garment/textile technologist    Social History Main Topics  . Smoking status: Former Smoker -- 2.0 packs/day for 39 years    Types: Cigarettes    Quit date: 03/14/2006  . Smokeless tobacco: Never Used  . Alcohol Use: Not on file  . Drug Use: Not on file  . Sexually Active: Not on file   Other Topics Concern  . Not on file   Social History Narrative  . No narrative on file     No Known Allergies   Outpatient Prescriptions Prior to Visit  Medication Sig Dispense Refill  . aspirin 81 MG tablet Take 81 mg by mouth daily.        . Calcium Carbonate-Vit D-Min (CALCIUM 1200 PO) Take 1 tablet by mouth daily.        . Multiple Vitamin (MULTIVITAMIN) tablet Take 1 tablet by mouth daily.        Marland Kitchen ipratropium (ATROVENT HFA) 17 MCG/ACT inhaler Inhale 2 puffs into the lungs 3 (three) times daily.  1 Inhaler  11     Review of Systems  Constitutional:   No  weight loss, night sweats,  Fevers, chills, fatigue, lassitude. HEENT:   No headaches,  Difficulty swallowing,  Tooth/dental problems,  Sore throat,                No sneezing, itching, ear ache, nasal congestion, post nasal drip,   CV:  No chest pain,  Orthopnea, PND, swelling in lower  extremities, anasarca, dizziness, palpitations  GI  No heartburn, indigestion, abdominal pain, nausea, vomiting, diarrhea, change in bowel habits, loss of appetite  Resp: Notes shortness of breath with exertion not at rest.  No excess mucus, no productive cough,  No non-productive cough,  No coughing up of blood.  No change in color of mucus.  No wheezing.  No chest wall deformity  Skin: no rash or lesions.  GU: no dysuria, change in color of urine, no urgency or frequency.  No flank pain.  MS:  No joint pain or swelling.  No decreased range of motion.  No back pain.  Psych:  No change in mood or affect. No depression or anxiety.  No memory loss.     Objective:   Physical Exam  Filed Vitals:   11/03/10 1434  BP: 126/82  Pulse: 77  Temp: 97.5 F (36.4 C)  TempSrc: Oral  Height: 5\' 9"  (1.753 m)  Weight: 165 lb (74.844 kg)  SpO2: 93%    Gen: Pleasant, well-nourished, in no  distress,  normal affect  ENT: No lesions,  mouth clear,  oropharynx clear, no postnasal drip  Neck: No JVD, no TMG, no carotid bruits  Lungs: No use of accessory muscles, no dullness to percussion, distant BS Cardiovascular: RRR, heart sounds normal, no murmur or gallops, no peripheral edema  Abdomen: soft and NT, no HSM,  BS normal  Musculoskeletal: No deformities, no cyanosis or clubbing  Neuro: alert, non focal  Skin: Warm, no lesions or rashes      PFT Conversion 08/03/2009  FVC 3.47  FVC PREDICT   FVC  % Predicted 75.1  FEV1 1.73  FEV1 PREDICT 3.50  FEV % Predicted 49.4  FEV1/FVC 49.9  FEV1/FVC PRE   FEV1/FVC%EXP 65.90  FeF 25-75   FeF 25-75 % Predicted   FEF % EXPEC   POST FVC   FVCPRDPST   POST FVC%EXP   POST FEV1   FEV1PRDPST   POSTFEV1%PRD   PSTFEV1/FVC   FEV1FVCPRDPS   PSTFEF25/75%   PSTFEF25/75P   FEF2575%EXPS   Residual Volume (ml)   RV % EXPECT   DLCO (ml/mmHg sec)   DLCO % EXPEC   DLCO/VA   DLCO/VA%EXP   PEF % EXPECT 9.05  PFT RSLT     Assessment & Plan:     COPD Copd golds III exacerbation Plan D/c atrovent Start Advair two puff twice daily Take prednisone 10mg  Take 4 for two days, three for two days, two for two days, then one for two days and stop No other medication changes Return 3 months       Updated Medication List Outpatient Encounter Prescriptions as of 11/03/2010  Medication Sig Dispense Refill  . aspirin 81 MG tablet Take 81 mg by mouth daily.        . Calcium Carbonate-Vit D-Min (CALCIUM 1200 PO) Take 1 tablet by mouth daily.        . Multiple Vitamin (MULTIVITAMIN) tablet Take 1 tablet by mouth daily.        Marland Kitchen DISCONTD: ipratropium (ATROVENT HFA) 17 MCG/ACT inhaler Inhale 2 puffs into the lungs 3 (three) times daily.  1 Inhaler  11  . fluticasone-salmeterol (ADVAIR HFA) 115-21 MCG/ACT inhaler Inhale 2 puffs into the lungs 2 (two) times daily.  1 Inhaler  6  . predniSONE (DELTASONE) 10 MG tablet Take 4 for two days, three for two days, two for two days, then one for two days and stop   20 tablet  0

## 2010-11-04 NOTE — Assessment & Plan Note (Addendum)
Copd golds III exacerbation Plan D/c atrovent Start Advair two puff twice daily Take prednisone 10mg  Take 4 for two days, three for two days, two for two days, then one for two days and stop No other medication changes Return 3 months

## 2010-12-06 ENCOUNTER — Telehealth: Payer: Self-pay | Admitting: Critical Care Medicine

## 2010-12-06 MED ORDER — BECLOMETHASONE DIPROPIONATE 40 MCG/ACT IN AERS: 2.0000 | INHALATION_SPRAY | Freq: Two times a day (BID) | RESPIRATORY_TRACT | Status: DC

## 2010-12-06 NOTE — Telephone Encounter (Signed)
Ok to stop advair and resume qvar two puff bid

## 2010-12-06 NOTE — Telephone Encounter (Signed)
I spoke with pt wife and she states pt has been c/o that his breathing is getting gradually worse on the advair. Pt has also been c/o of headaches everyday as well since starting the advair 11/03/10. Pt wife is wanting to know if pt can stop the advair and go back to the qvar. Pt wife states the qvar did help with his breathing much better than the advair. Please advise Dr. Delford Field. Thanks  Carver Fila, CMA

## 2010-12-06 NOTE — Telephone Encounter (Signed)
Pt wife is aware pt to stop advair and start qvar. Pt needed an rx sent to the pharmacy.

## 2010-12-13 ENCOUNTER — Telehealth: Payer: Self-pay | Admitting: Critical Care Medicine

## 2010-12-13 DIAGNOSIS — J441 Chronic obstructive pulmonary disease with (acute) exacerbation: Secondary | ICD-10-CM

## 2010-12-13 MED ORDER — PREDNISONE 10 MG PO TABS: ORAL_TABLET | ORAL | Status: DC

## 2010-12-13 NOTE — Telephone Encounter (Signed)
Take prednisone 10mg  Take 4 for two days, three for two days, two for two days, then one for two days and stop OV  With TP or PW if no better in 1 wk

## 2010-12-13 NOTE — Telephone Encounter (Signed)
Pt aware of ra recs. Nothing further was needed and rx sent

## 2010-12-13 NOTE — Telephone Encounter (Signed)
I spoke with the pt wife and she states the pt was placed on Advair on 11-03-10 at last OV and pred taper. She states while he was on the prednisone his breathing was improved, but once he stopped the prednisone his breathing gradually became worse. So they called on 12-06-10 and Dr. Delford Field changed Advair to Qvar. Pt wife states pt SOB has not gotten any worse since starting the qvar, but it has not gotten any better and she is requesting a refill on pred taper. Pt only complaint is increased SOB. He denies any cough, congestion, fever. Please advise. Carron Curie, CMA No Known Allergies

## 2011-01-04 ENCOUNTER — Encounter: Payer: Self-pay | Admitting: Adult Health

## 2011-01-04 ENCOUNTER — Ambulatory Visit (INDEPENDENT_AMBULATORY_CARE_PROVIDER_SITE_OTHER)
Admission: RE | Admit: 2011-01-04 | Discharge: 2011-01-04 | Disposition: A | Discharge status: Home / Self Care | Payer: PRIVATE HEALTH INSURANCE | Source: Ambulatory Visit | Attending: Adult Health | Admitting: Adult Health

## 2011-01-04 ENCOUNTER — Ambulatory Visit (INDEPENDENT_AMBULATORY_CARE_PROVIDER_SITE_OTHER): Payer: PRIVATE HEALTH INSURANCE | Admitting: Adult Health

## 2011-01-04 DIAGNOSIS — R05 Cough: Secondary | ICD-10-CM

## 2011-01-04 DIAGNOSIS — J4489 Other specified chronic obstructive pulmonary disease: Secondary | ICD-10-CM

## 2011-01-04 DIAGNOSIS — R059 Cough, unspecified: Secondary | ICD-10-CM

## 2011-01-04 DIAGNOSIS — R0602 Shortness of breath: Secondary | ICD-10-CM

## 2011-01-04 DIAGNOSIS — J449 Chronic obstructive pulmonary disease, unspecified: Secondary | ICD-10-CM

## 2011-01-04 MED ORDER — BUDESONIDE-FORMOTEROL FUMARATE 160-4.5 MCG/ACT IN AERO: 2.0000 | INHALATION_SPRAY | Freq: Two times a day (BID) | RESPIRATORY_TRACT | Status: DC

## 2011-01-04 NOTE — Progress Notes (Signed)
Addended by: Julaine Hua on: 01/04/2011 04:18 PM   Modules accepted: Orders

## 2011-01-04 NOTE — Progress Notes (Signed)
Subjective:    Patient ID: Russell Schaefer, male    DOB: 19-Mar-1950, 60 y.o.   MRN: [id number]  HPI  60 y.o. WM with Golds Stage III copd FeV1 50% prd 07/14/10 Worse on rainy wet days.  Dyspnea is unchanged.  Not much cough.  No real chest pain.  No edema.    8/22 Much worse with heat over the summer.  Notes no more cough.  No chest pain.  No real wheeze.   Mucus is white. Notes some drainage out of nose,   Dyspnea at rest and exertion. >>changed to Advair and steroid taper   01/04/2011 Acute OV  Complains of increased DOE with activity. Had flare of wheezing and cough 2 weeks ago, tx w/ steroid taper . Did improve briefly but now back to increased DOE . He took Advair until sample was gone. He does not have rx coverage and is unable to pay for inhaler. Also says Advair and Spiriva have caused mouth irritation in past. Says he can not afford Atrovent that he was previosuly on in past due to cost. We discussed his last PFT w/ FEV1 ~50%. Will attempt trial of Symbicort and look for pt assistance program to help with cost factor .  No hemoptysis , chest pain or edema.     Past Medical History  Diagnosis Date  . COPD (chronic obstructive pulmonary disease)      Family History  Problem Relation Age of Onset  . COPD       History   Social History  . Marital Status: Married    Spouse Name: N/A    Number of Children: N/A  . Years of Education: N/A   Occupational History  . Garment/textile technologist    Social History Main Topics  . Smoking status: Former Smoker -- 2.0 packs/day for 39 years    Types: Cigarettes    Quit date: 03/14/2006  . Smokeless tobacco: Never Used  . Alcohol Use: Not on file  . Drug Use: Not on file  . Sexually Active: Not on file   Other Topics Concern  . Not on file   Social History Narrative  . No narrative on file     No Known Allergies   Outpatient Prescriptions Prior to Visit  Medication Sig Dispense Refill  . aspirin 81 MG tablet Take 81 mg by  mouth daily.        . Calcium Carbonate-Vit D-Min (CALCIUM 1200 PO) Take 1 tablet by mouth daily.        . Multiple Vitamin (MULTIVITAMIN) tablet Take 1 tablet by mouth daily.        . beclomethasone (QVAR) 40 MCG/ACT inhaler Inhale 2 puffs into the lungs 2 (two) times daily.  1 Inhaler  6  . predniSONE (DELTASONE) 10 MG tablet Take 4 for two days, 3 for two days, 2 for two days, then 1 for two days and stop  20 tablet  0     Review of Systems  Constitutional:   No  weight loss, night sweats,  Fevers, chills, HEENT:   No headaches,  Difficulty swallowing,  Tooth/dental problems,  Sore throat,                No sneezing, itching, ear ache, nasal congestion, post nasal drip,   CV:  No chest pain,  Orthopnea, PND, swelling in lower extremities, anasarca, dizziness, palpitations  GI  No heartburn, indigestion, abdominal pain, nausea, vomiting, diarrhea, change in bowel habits, loss of appetite  Resp: Notes shortness of breath with exertion not at rest.  No excess mucus, no productive cough,  No non-productive cough,  No coughing up of blood.  No change in color of mucus.  No wheezing.  No chest wall deformity  Skin: no rash or lesions.  GU: no dysuria, change in color of urine, no urgency or frequency.  No flank pain.  MS:  No joint pain or swelling.  No decreased range of motion.  No back pain.  Psych:  No change in mood or affect. No depression or anxiety.  No memory loss.     Objective:   Physical Exam  Filed Vitals:   01/04/11 1506  BP: 130/86  Pulse: 68  Temp: 96.8 F (36 C)  TempSrc: Oral  Height: 5\' 9"  (1.753 m)  Weight: 166 lb 3.2 oz (75.388 kg)  SpO2: 93%    Gen: Pleasant, well-nourished, in no distress,  normal affect  ENT: No lesions,  mouth clear,  oropharynx clear, no postnasal drip  Neck: No JVD, no TMG, no carotid bruits  Lungs: No use of accessory muscles, no dullness to percussion, distant BS  Cardiovascular: RRR, heart sounds normal, no murmur or  gallops, no peripheral edema  Abdomen: soft and NT, no HSM,  BS normal  Musculoskeletal: No deformities, no cyanosis or clubbing  Neuro: alert, non focal  Skin: Warm, no lesions or rashes      PFT Conversion 08/03/2009  FVC 3.47  FVC PREDICT   FVC  % Predicted 75.1  FEV1 1.73  FEV1 PREDICT 3.50  FEV % Predicted 49.4  FEV1/FVC 49.9  FEV1/FVC PRE   FEV1/FVC%EXP 65.90  FeF 25-75   FeF 25-75 % Predicted   FEF % EXPEC   POST FVC   FVCPRDPST   POST FVC%EXP   POST FEV1   FEV1PRDPST   POSTFEV1%PRD   PSTFEV1/FVC   FEV1FVCPRDPS   PSTFEF25/75%   PSTFEF25/75P   FEF2575%EXPS   Residual Volume (ml)   RV % EXPECT   DLCO (ml/mmHg sec)   DLCO % EXPEC   DLCO/VA   DLCO/VA%EXP   PEF % EXPECT 9.05  PFT RSLT     Assessment & Plan:   COPD Symptomatic off therapy, unfornuately pt does not have rx coverage  Will try to get on pt assistance program. On symbicort  Check cxr today   Plan;  Begin Symbicort 160/4.76mcg 2 puffs Twice daily   Please see [facility] to get paperwork for Symbicort patient assistance program  I will call with xray results.  follow up Dr. Delford Field  In 4 weeks and As needed          Updated Medication List Outpatient Encounter Prescriptions as of 01/04/2011  Medication Sig Dispense Refill  . aspirin 81 MG tablet Take 81 mg by mouth daily.        . Calcium Carbonate-Vit D-Min (CALCIUM 1200 PO) Take 1 tablet by mouth daily.        . Multiple Vitamin (MULTIVITAMIN) tablet Take 1 tablet by mouth daily.        Marland Kitchen DISCONTD: beclomethasone (QVAR) 40 MCG/ACT inhaler Inhale 2 puffs into the lungs 2 (two) times daily.  1 Inhaler  6  . budesonide-formoterol (SYMBICORT) 160-4.5 MCG/ACT inhaler Inhale 2 puffs into the lungs 2 (two) times daily.      . predniSONE (DELTASONE) 10 MG tablet Take 4 for two days, 3 for two days, 2 for two days, then 1 for two days and stop  20 tablet  0

## 2011-01-04 NOTE — Assessment & Plan Note (Addendum)
Symptomatic off therapy, unfornuately pt does not have rx coverage  Will try to get on pt assistance program. On symbicort  Check cxr today   Plan;  Begin Symbicort 160/4.7mcg 2 puffs Twice daily   Please see [facility] to get paperwork for Symbicort patient assistance program  I will call with xray results.  follow up Dr. Delford Field  In 4 weeks and As needed

## 2011-01-04 NOTE — Patient Instructions (Signed)
Begin Symbicort 160/4.35mcg 2 puffs Twice daily   Please see [facility] to get paperwork for Symbicort patient assistance program  I will call with xray results.  follow up Dr. Delford Field  In 4 weeks and As needed

## 2011-01-06 ENCOUNTER — Telehealth: Payer: Self-pay | Admitting: Adult Health

## 2011-01-06 NOTE — Telephone Encounter (Signed)
DISREGARD MSG. SPOUSE CALLED BACK- SAYS PT JUST FOUND RX IN THE CAR. NOTHING FURTHER NEEDED. Russell Schaefer

## 2011-01-17 ENCOUNTER — Telehealth: Payer: Self-pay | Admitting: Critical Care Medicine

## 2011-01-17 NOTE — Telephone Encounter (Signed)
Sample left at front. Pt wife aware. Carron Curie, CMA

## 2011-01-19 ENCOUNTER — Ambulatory Visit: Payer: PRIVATE HEALTH INSURANCE | Admitting: Critical Care Medicine

## 2011-02-02 ENCOUNTER — Ambulatory Visit: Payer: PRIVATE HEALTH INSURANCE | Admitting: Critical Care Medicine

## 2011-02-08 ENCOUNTER — Ambulatory Visit: Payer: PRIVATE HEALTH INSURANCE | Admitting: Critical Care Medicine

## 2011-02-16 ENCOUNTER — Ambulatory Visit: Payer: PRIVATE HEALTH INSURANCE | Admitting: Critical Care Medicine

## 2011-03-22 ENCOUNTER — Ambulatory Visit (INDEPENDENT_AMBULATORY_CARE_PROVIDER_SITE_OTHER): Payer: PRIVATE HEALTH INSURANCE | Admitting: Critical Care Medicine

## 2011-03-22 ENCOUNTER — Encounter: Payer: Self-pay | Admitting: Critical Care Medicine

## 2011-03-22 VITALS — BP 138/80 | HR 64 | Temp 97.5°F | Ht 69.0 in | Wt 163.8 lb

## 2011-03-22 DIAGNOSIS — J449 Chronic obstructive pulmonary disease, unspecified: Secondary | ICD-10-CM

## 2011-03-22 MED ORDER — BUDESONIDE-FORMOTEROL FUMARATE 160-4.5 MCG/ACT IN AERO: 2.0000 | INHALATION_SPRAY | Freq: Two times a day (BID) | RESPIRATORY_TRACT | Status: DC

## 2011-03-22 NOTE — Progress Notes (Signed)
Subjective:    Patient ID: Russell Schaefer, male    DOB: [birth year], 61 y.o.   MRN: [id number]  HPI  61 y.o. WM with Golds Stage III copd FeV1 50% prd 07/14/10 Worse on rainy wet days.  Dyspnea is unchanged.  Not much cough.  No real chest pain.  No edema.    8/22 Much worse with heat over the summer.  Notes no more cough.  No chest pain.  No real wheeze.   Mucus is white. Notes some drainage out of nose,   Dyspnea at rest and exertion. >>changed to Advair and steroid taper   10/23 Acute OV  Complains of increased DOE with activity. Had flare of wheezing and cough 2 weeks ago, tx w/ steroid taper . Did improve briefly but now back to increased DOE . He took Advair until sample was gone. He does not have rx coverage and is unable to pay for inhaler. Also says Advair and Spiriva have caused mouth irritation in past. Says he can not afford Atrovent that he was previosuly on in past due to cost. We discussed his last PFT w/ FEV1 ~50%. Will attempt trial of Symbicort and look for pt assistance program to help with cost factor .  No hemoptysis , chest pain or edema.   1/8 Started symbicort at last ov and has done well Pt denies any significant sore throat, nasal congestion or excess secretions, fever, chills, sweats, unintended weight loss, pleurtic or exertional chest pain, orthopnea PND, or leg swelling Pt denies any increase in rescue therapy over baseline, denies waking up needing it or having any early am or nocturnal exacerbations of coughing/wheezing/or dyspnea. Pt also denies any obvious fluctuation in symptoms with  weather or environmental change or other alleviating or aggravating factors  Past Medical History  Diagnosis Date  . COPD (chronic obstructive pulmonary disease)      Family History  Problem Relation Age of Onset  . COPD       History   Social History  . Marital Status: Married    Spouse Name: N/A    Number of Children: N/A  . Years of Education: N/A   Occupational History  . Garment/textile technologist     Social History Main Topics  . Smoking status: Former Smoker -- 2.0 packs/day for 39 years    Types: Cigarettes    Quit date: 03/14/2006  . Smokeless tobacco: Never Used  . Alcohol Use: Not on file  . Drug Use: Not on file  . Sexually Active: Not on file   Other Topics Concern  . Not on file   Social History Narrative  . No narrative on file     No Known Allergies   Outpatient Prescriptions Prior to Visit  Medication Sig Dispense Refill  . aspirin 81 MG tablet Take 81 mg by mouth daily.        . Calcium Carbonate-Vit D-Min (CALCIUM 1200 PO) Take 1 tablet by mouth daily.        . Multiple Vitamin (MULTIVITAMIN) tablet Take 1 tablet by mouth daily.        . budesonide-formoterol (SYMBICORT) 160-4.5 MCG/ACT inhaler Inhale 2 puffs into the lungs 2 (two) times daily.  3 Inhaler  3  . predniSONE (DELTASONE) 10 MG tablet Take 4 for two days, 3 for two days, 2 for two days, then 1 for two days and stop  20 tablet  0     Review of Systems  Constitutional:   No  weight loss, night sweats,  Fevers, chills, HEENT:   No headaches,  Difficulty swallowing,  Tooth/dental problems,  Sore throat,                No sneezing, itching, ear ache, nasal congestion, post nasal drip,   CV:  No chest pain,  Orthopnea, PND, swelling in lower extremities, anasarca, dizziness, palpitations  GI  No heartburn, indigestion, abdominal pain, nausea, vomiting, diarrhea, change in bowel habits, loss of appetite  Resp: Notes shortness of breath with exertion not at rest.  No excess mucus, no productive cough,  No non-productive cough,  No coughing up of blood.  No change in color of mucus.  No wheezing.  No chest wall deformity  Skin: no rash or lesions.  GU: no dysuria, change in color of urine, no urgency or frequency.  No flank pain.  MS:  No joint pain or swelling.  No decreased range of motion.  No back pain.  Psych:  No change in mood or affect. No depression or anxiety.  No memory loss.      Objective:   Physical Exam  Filed Vitals:   03/22/11 1146  BP: 138/80  Pulse: 64  Temp: 97.5 F (36.4 C)  TempSrc: Oral  Height: 5\' 9"  (1.753 m)  Weight: 163 lb 12.8 oz (74.299 kg)  SpO2: 95%    Gen: Pleasant, well-nourished, in no distress,  normal affect  ENT: No lesions,  mouth clear,  oropharynx clear, no postnasal drip  Neck: No JVD, no TMG, no carotid bruits  Lungs: No use of accessory muscles, no dullness to percussion, distant BS  Cardiovascular: RRR, heart sounds normal, no murmur or gallops, no peripheral edema  Abdomen: soft and NT, no HSM,  BS normal  Musculoskeletal: No deformities,  no cyanosis or clubbing  Neuro: alert, non focal  Skin: Warm, no lesions or rashes      PFT Conversion 08/03/2009  FVC 3.47  FVC PREDICT   FVC  % Predicted 75.1  FEV1 1.73  FEV1 PREDICT 3.50  FEV % Predicted 49.4  FEV1/FVC 49.9  FEV1/FVC PRE   FEV1/FVC%EXP 65.90  FeF 25-75   FeF 25-75 % Predicted   FEF % EXPEC   POST FVC   FVCPRDPST   POST FVC%EXP   POST FEV1   FEV1PRDPST   POSTFEV1%PRD   PSTFEV1/FVC   FEV1FVCPRDPS   PSTFEF25/75%   PSTFEF25/75P   FEF2575%EXPS   Residual Volume (ml)   RV % EXPECT   DLCO (ml/mmHg sec)   DLCO % EXPEC   DLCO/VA   DLCO/VA%EXP   PEF % EXPECT 9.05  PFT RSLT     Assessment & Plan:   COPD Copd golds III improved with symbicort Plan Cont symbicort        Updated Medication List Outpatient Encounter Prescriptions as of 03/22/2011  Medication Sig Dispense Refill  . Ascorbic Acid (VITAMIN C) 1000 MG tablet Take 1,000 mg by mouth 2 (two) times daily.        Marland Kitchen aspirin 81 MG tablet Take 81 mg by mouth daily.        . budesonide-formoterol (SYMBICORT) 160-4.5 MCG/ACT inhaler Inhale 2 puffs into the lungs 2 (two) times daily.  3 Inhaler  3  . Calcium Carbonate-Vit D-Min (CALCIUM 1200 PO) Take 1 tablet by mouth daily.        . Multiple Vitamin (MULTIVITAMIN) tablet Take 1 tablet by mouth daily.        Marland Kitchen DISCONTD:  budesonide-formoterol (SYMBICORT) 160-4.5 MCG/ACT inhaler Inhale 2 puffs into the lungs 2 (two) times daily.  3 Inhaler  3  . DISCONTD: predniSONE (DELTASONE) 10 MG tablet Take 4 for two days, 3 for two days, 2 for two days, then 1 for two days and stop  20 tablet  0

## 2011-03-22 NOTE — Patient Instructions (Signed)
No change in medications. Return in         4 months 

## 2011-03-24 NOTE — Assessment & Plan Note (Signed)
Copd golds III improved with symbicort Plan Cont symbicort

## 2011-07-19 ENCOUNTER — Telehealth: Payer: Self-pay | Admitting: Internal Medicine

## 2011-07-19 NOTE — Telephone Encounter (Signed)
Received copies from [facility] ,on 07/19/11 . Forwarded 8   pages to Dr. Jonny Ruiz ,for review.

## 2011-08-31 ENCOUNTER — Encounter: Payer: Self-pay | Admitting: Critical Care Medicine

## 2011-08-31 ENCOUNTER — Ambulatory Visit (INDEPENDENT_AMBULATORY_CARE_PROVIDER_SITE_OTHER): Payer: BC Managed Care – PPO | Admitting: Critical Care Medicine

## 2011-08-31 VITALS — BP 120/72 | HR 81 | Temp 97.8°F | Ht 69.0 in | Wt 164.0 lb

## 2011-08-31 DIAGNOSIS — J449 Chronic obstructive pulmonary disease, unspecified: Secondary | ICD-10-CM

## 2011-08-31 MED ORDER — BUDESONIDE-FORMOTEROL FUMARATE 160-4.5 MCG/ACT IN AERO: 2.0000 | INHALATION_SPRAY | Freq: Two times a day (BID) | RESPIRATORY_TRACT | Status: DC

## 2011-08-31 NOTE — Patient Instructions (Addendum)
No change in medications. Return in        6 months        

## 2011-08-31 NOTE — Progress Notes (Signed)
Subjective:    Patient ID: Russell Schaefer, male    DOB: 1951/01/10, 61 y.o.   MRN: [id number]  HPI  61 y.o. WM with Golds Stage C  copd FeV1 45 % prd                                                                                                                                                                       08/31/2011 CAT 7.  Pt doing well on symbicort.  Still working in the shop.  No smoking. Pt denies any significant sore throat, nasal congestion or excess secretions, fever, chills, sweats, unintended weight loss, pleurtic or exertional chest pain, orthopnea PND, or leg swelling Pt denies any increase in rescue therapy over baseline, denies waking up needing it or having any early am or nocturnal exacerbations of coughing/wheezing/or dyspnea. Pt also denies any obvious fluctuation in symptoms with  weather or environmental change or other alleviating or aggravating factors  Past Medical History  Diagnosis Date  . COPD (chronic obstructive pulmonary disease)      Family History  Problem Relation Age of Onset  . COPD       History   Social History  . Marital Status: Married    Spouse Name: N/A    Number of Children: N/A  . Years of Education: N/A    Occupational History  . Garment/textile technologist    Social History Main Topics  . Smoking status: Former Smoker -- 2.0 packs/day for 39 years    Types: Cigarettes    Quit date: 03/14/2006  . Smokeless tobacco: Never Used  . Alcohol Use: Not on file  . Drug Use: Not on file  . Sexually Active: Not on file   Other Topics Concern  . Not on file   Social History Narrative  . No narrative on file     No Known Allergies   Outpatient Prescriptions Prior to Visit  Medication Sig Dispense Refill  . Ascorbic Acid (VITAMIN C) 1000 MG tablet Take 1,000 mg by mouth 2 (two) times daily.        Marland Kitchen aspirin 81 MG tablet Take 81 mg by mouth daily.        . Calcium Carbonate-Vit D-Min (CALCIUM 1200 PO) Take 1 tablet by mouth daily.        . Multiple Vitamin (MULTIVITAMIN) tablet Take 1 tablet by mouth daily.        . budesonide-formoterol (SYMBICORT) 160-4.5 MCG/ACT inhaler Inhale 2 puffs into the lungs 2 (two) times daily.  3 Inhaler  3     Review of Systems  Constitutional:   No  weight loss, night sweats,  Fevers, chills, HEENT:   No headaches,  Difficulty swallowing,  Tooth/dental problems,  Sore throat,                No sneezing, itching, ear ache, nasal congestion, post nasal drip,   CV:  No chest pain,  Orthopnea, PND, swelling in lower extremities, anasarca, dizziness, palpitations  GI  No heartburn, indigestion, abdominal pain, nausea, vomiting, diarrhea, change in bowel habits, loss of appetite  Resp: Notes shortness of breath with exertion not at rest.  No excess mucus, no productive cough,  No non-productive cough,  No coughing up of blood.  No change in color of mucus.  No wheezing.  No chest wall deformity  Skin: no rash or lesions.  GU: no dysuria, change in color of urine, no urgency or frequency.  No flank pain.  MS:  No joint pain or swelling.  No decreased range of motion.  No back pain.  Psych:  No change in mood or affect. No depression or anxiety.  No memory  loss.     Objective:   Physical Exam  Filed Vitals:   08/31/11 1432  BP: 120/72  Pulse: 81  Temp: 97.8 F (36.6 C)  TempSrc: Oral  Height: 5\' 9"  (1.753 m)  Weight: 164 lb (74.39 kg)  SpO2: 91%    Gen: Pleasant, well-nourished, in no distress,  normal affect  ENT: No lesions,  mouth clear,  oropharynx clear, no postnasal drip  Neck: No JVD, no TMG, no carotid bruits  Lungs: No use of accessory muscles, no dullness to percussion, distant BS  Cardiovascular: RRR, heart sounds normal, no murmur or gallops, no peripheral edema  Abdomen: soft and NT, no HSM,  BS normal  Musculoskeletal: No deformities, no cyanosis or clubbing  Neuro: alert, non focal  Skin: Warm, no lesions or rashes        Assessment & Plan:   COPD Golds Stage C  Copd  exsmoker 08/31/2011  Spirometry FeV1 40%  FeV1/FVC 61%   Stable COPD at this time Plan Maintain Symbicort as prescribed Return 6 months      Updated Medication List Outpatient Encounter Prescriptions as of 08/31/2011  Medication Sig Dispense Refill  . Ascorbic Acid (VITAMIN C) 1000 MG tablet Take 1,000 mg by mouth 2 (two) times daily.        Marland Kitchen aspirin 81 MG tablet Take 81 mg by mouth daily.        . budesonide-formoterol (SYMBICORT) 160-4.5 MCG/ACT inhaler Inhale 2 puffs into the lungs 2 (two) times daily.  3 Inhaler  4  . Calcium Carbonate-Vit D-Min (CALCIUM 1200 PO) Take 1 tablet by mouth daily.        . Multiple Vitamin (MULTIVITAMIN) tablet Take 1 tablet by mouth daily.        Marland Kitchen DISCONTD: budesonide-formoterol (SYMBICORT) 160-4.5 MCG/ACT inhaler Inhale 2 puffs into the lungs 2 (two) times daily.  3 Inhaler  3  . DISCONTD: budesonide-formoterol (SYMBICORT) 160-4.5 MCG/ACT inhaler Inhale 2 puffs into the lungs 2 (two) times daily.  3 Inhaler  4

## 2011-08-31 NOTE — Assessment & Plan Note (Signed)
Golds Stage C  Copd  exsmoker 08/31/2011  Spirometry FeV1 40%  FeV1/FVC 61%   Stable COPD at this time Plan Maintain Symbicort as prescribed Return 6 months

## 2012-02-28 ENCOUNTER — Ambulatory Visit (INDEPENDENT_AMBULATORY_CARE_PROVIDER_SITE_OTHER): Payer: BC Managed Care – PPO | Admitting: Critical Care Medicine

## 2012-02-28 ENCOUNTER — Encounter: Payer: Self-pay | Admitting: Critical Care Medicine

## 2012-02-28 VITALS — BP 126/76 | HR 82 | Temp 97.6°F | Ht 69.0 in | Wt 161.8 lb

## 2012-02-28 DIAGNOSIS — Z23 Encounter for immunization: Secondary | ICD-10-CM

## 2012-02-28 DIAGNOSIS — J449 Chronic obstructive pulmonary disease, unspecified: Secondary | ICD-10-CM

## 2012-02-28 MED ORDER — BUDESONIDE-FORMOTEROL FUMARATE 160-4.5 MCG/ACT IN AERO: 2.0000 | INHALATION_SPRAY | Freq: Two times a day (BID) | RESPIRATORY_TRACT | Status: DC

## 2012-02-28 NOTE — Assessment & Plan Note (Signed)
Gold stage C. COPD stable at this time Plan Maintain inhaled medications as prescribed Administer flu vaccine and Pneumovax at this visit

## 2012-02-28 NOTE — Progress Notes (Signed)
Subjective:    Patient ID: Russell Schaefer, male    DOB: 23-Feb-1951, 61 y.o.   MRN: [id number]  HPI  61 y.o. WM with Golds Stage C  copd FeV1 45 % prd                                                                                                                                                                       08/31/2011 CAT 7.  Pt doing well on symbicort.  Still working in the shop.  No smoking. Pt denies any significant sore throat, nasal congestion or excess secretions, fever, chills, sweats, unintended weight loss, pleurtic or exertional chest pain, orthopnea PND, or leg swelling Pt denies any increase in rescue therapy over baseline, denies waking up needing it or having any early am or nocturnal exacerbations of coughing/wheezing/or dyspnea. Pt also denies any obvious fluctuation in symptoms with  weather or environmental change or other alleviating or aggravating factors                                                                                                                                                                         02/28/2012 Notes some cough, min productive.  Pt is dyspneic with exertion. No qhs dyspnea.  No real chest pain. Pt denies any significant sore throat, nasal congestion or excess secretions, fever, chills, sweats, unintended weight loss, pleurtic or exertional chest pain, orthopnea PND, or leg swelling Pt denies any increase in rescue therapy over baseline, denies waking up needing it or having any early am or nocturnal exacerbations of coughing/wheezing/or dyspnea. Pt also denies any obvious fluctuation in symptoms with  weather or environmental change or other alleviating or aggravating factors  Past Medical History  Diagnosis Date  . COPD (chronic obstructive pulmonary disease)      Family History  Problem Relation Age of Onset  . COPD       History   Social History  . Marital Status: Married    Spouse Name: N/A    Number of Children: N/A  . Years of Education: N/A   Occupational History  . Garment/textile technologist    Social History Main Topics  . Smoking status: Former Smoker -- 2.0 packs/day for 39 years    Types: Cigarettes    Quit date: 03/14/2006  . Smokeless tobacco: Never Used  . Alcohol Use: Not on file  . Drug Use: Not on file  . Sexually Active: Not on file   Other Topics Concern  . Not on file   Social History Narrative  . No narrative on file     No Known Allergies   Outpatient Prescriptions Prior to Visit  Medication Sig Dispense Refill  . Ascorbic Acid (VITAMIN C) 1000 MG tablet Take 1,000 mg by mouth 2 (two) times daily.        Marland Kitchen aspirin 81 MG tablet Take 81 mg by mouth daily.        . Calcium Carbonate-Vit D-Min (CALCIUM 1200 PO) Take 1 tablet by mouth daily.        . Multiple Vitamin (MULTIVITAMIN) tablet Take 1 tablet by mouth daily.        . [DISCONTINUED] budesonide-formoterol (SYMBICORT) 160-4.5 MCG/ACT inhaler Inhale 2 puffs into the lungs 2 (two) times daily.  3 Inhaler  4  Last reviewed on 02/28/2012  2:13 PM by Storm Frisk, MD   Review of Systems  Constitutional:   No  weight loss, night sweats,  Fevers, chills, HEENT:   No headaches,  Difficulty swallowing,  Tooth/dental problems,  Sore throat,                No sneezing, itching, ear ache, nasal congestion, post nasal drip,   CV:  No chest pain,  Orthopnea, PND,  swelling in lower extremities, anasarca, dizziness, palpitations  GI  No heartburn, indigestion, abdominal pain, nausea, vomiting, diarrhea, change in bowel habits, loss of appetite  Resp: Notes shortness of breath with exertion not at rest.  No excess mucus, no productive cough,  No non-productive cough,  No coughing up of blood.  No change in color of mucus.  No wheezing.  No chest wall deformity  Skin: no rash or lesions.  GU: no dysuria, change in color of urine, no urgency or frequency.  No flank pain.  MS:  No joint pain or swelling.  No decreased range of motion.  No back pain.  Psych:  No change in mood or affect. No depression or anxiety.  No memory loss.     Objective:   Physical Exam  Filed Vitals:   02/28/12 1405  BP: 126/76  Pulse: 82  Temp: 97.6 F (36.4 C)  TempSrc: Oral  Height: 5\' 9"  (1.753 m)  Weight: 161 lb 12.8 oz (73.392 kg)  SpO2: 92%    Gen: Pleasant, well-nourished, in no distress,  normal affect  ENT: No lesions,  mouth clear,  oropharynx clear, no postnasal drip  Neck: No JVD, no TMG, no carotid bruits  Lungs: No use of accessory muscles, no dullness to percussion, distant BS  Cardiovascular: RRR, heart sounds normal, no murmur or gallops, no peripheral edema  Abdomen: soft and NT, no HSM,  BS normal  Musculoskeletal: No deformities, no cyanosis or clubbing  Neuro: alert, non focal  Skin: Warm, no lesions or rashes        Assessment & Plan:   COPD Gold stage C. COPD stable at this time Plan Maintain inhaled medications as prescribed Administer flu vaccine and Pneumovax at this visit      Updated Medication List Outpatient Encounter Prescriptions as of 02/28/2012  Medication Sig Dispense Refill  . Ascorbic Acid (VITAMIN C) 1000 MG tablet Take 1,000 mg by mouth 2 (two) times daily.        Marland Kitchen aspirin 81 MG tablet Take 81 mg by mouth daily.        . budesonide-formoterol (SYMBICORT) 160-4.5 MCG/ACT inhaler Inhale 2 puffs into  the lungs 2 (two) times daily.  3 Inhaler  4  . Calcium Carbonate-Vit D-Min (CALCIUM 1200 PO) Take 1 tablet by mouth daily.        . Multiple Vitamin (MULTIVITAMIN) tablet Take 1 tablet by mouth daily.        . [DISCONTINUED] budesonide-formoterol (SYMBICORT) 160-4.5 MCG/ACT inhaler Inhale 2 puffs into the lungs 2 (two) times daily.  3 Inhaler  4  . [DISCONTINUED] budesonide-formoterol (SYMBICORT) 160-4.5 MCG/ACT inhaler Inhale 2 puffs into the lungs 2 (two) times daily.  3 Inhaler  4

## 2012-02-28 NOTE — Patient Instructions (Addendum)
No change in medications. Return in         4 months 

## 2012-03-13 ENCOUNTER — Telehealth: Payer: Self-pay | Admitting: Critical Care Medicine

## 2012-03-13 NOTE — Telephone Encounter (Signed)
LMTCB

## 2012-03-15 MED ORDER — BUDESONIDE-FORMOTEROL FUMARATE 160-4.5 MCG/ACT IN AERO: 2.0000 | INHALATION_SPRAY | Freq: Two times a day (BID) | RESPIRATORY_TRACT | Status: DC

## 2012-03-15 NOTE — Telephone Encounter (Signed)
I spoke with the pt wife and she states that the rx they requested for symbicort at last OV was supposed to go to [facility]. She states the pt is enrolled in Patient assistance through them. I advised I will have rx printed and signed and we will fax it over. Rx printed and placed in [facility] look-at to sign. Carron Curie, CMA

## 2012-03-19 NOTE — Telephone Encounter (Signed)
Dr. Delford Field has signed Rx, Rx has been faxed to [phone] as requested ([facility]). Nothing further.   Faxed rx has been placed in [facility] look at.

## 2012-07-23 ENCOUNTER — Telehealth: Payer: Self-pay | Admitting: Critical Care Medicine

## 2012-07-23 NOTE — Telephone Encounter (Signed)
Pt's spouse called back & states that it really is a little too soon to get Rx filled & asked to cancel this message.  Pt's spouse will call in at later date to get refill.  Antionette Fairy

## 2012-08-20 ENCOUNTER — Telehealth: Payer: Self-pay | Admitting: Critical Care Medicine

## 2012-08-20 MED ORDER — BUDESONIDE-FORMOTEROL FUMARATE 160-4.5 MCG/ACT IN AERO: 2.0000 | INHALATION_SPRAY | Freq: Two times a day (BID) | RESPIRATORY_TRACT | Status: DC

## 2012-08-20 NOTE — Telephone Encounter (Signed)
Spouse aware rx has been sent. Nothing further was needed

## 2012-08-28 ENCOUNTER — Ambulatory Visit (INDEPENDENT_AMBULATORY_CARE_PROVIDER_SITE_OTHER): Payer: BC Managed Care – PPO | Admitting: Critical Care Medicine

## 2012-08-28 ENCOUNTER — Encounter: Payer: Self-pay | Admitting: Critical Care Medicine

## 2012-08-28 VITALS — BP 120/82 | HR 66 | Temp 97.6°F | Ht 69.0 in | Wt 161.0 lb

## 2012-08-28 DIAGNOSIS — J449 Chronic obstructive pulmonary disease, unspecified: Secondary | ICD-10-CM

## 2012-08-28 MED ORDER — ALBUTEROL SULFATE HFA 108 (90 BASE) MCG/ACT IN AERS: 2.0000 | INHALATION_SPRAY | Freq: Four times a day (QID) | RESPIRATORY_TRACT | Status: DC | PRN

## 2012-08-28 NOTE — Progress Notes (Signed)
Subjective:    Patient ID: Russell Schaefer, male    DOB: Dec 02, 1950, 62 y.o.   MRN: [id number]  HPI  62 y.o. WM with Golds Stage C  copd FeV1 45 % prd                                                                                                                            08/28/2012 Chief Complaint  Patient presents with  . COPD    Breathing is unchanged. Reports SOB, coughing. Denies chest tightness or wheezing. States that the warm weather makes it harder for him to breathe.   there are no new complaints. The patient's dyspnea levels the same. The patient struggling working in the heat. There is no excess cough. There is no chest discomfort.  Pt denies any significant sore throat, nasal congestion or excess secretions, fever, chills, sweats, unintended weight loss, pleurtic or exertional chest pain, orthopnea PND, or leg swelling Pt denies any increase in rescue therapy over baseline, denies waking up needing it or having any early am or nocturnal exacerbations of coughing/wheezing/or dyspnea. Pt also denies any obvious fluctuation in symptoms with  weather or environmental change or other alleviating or aggravating factors  Past Medical History  Diagnosis Date  . COPD (chronic obstructive pulmonary disease)      Family History  Problem Relation Age of Onset  . COPD       History   Social History  . Marital Status: Married    Spouse  Name: N/A    Number of Children: N/A  . Years of Education: N/A   Occupational History  . Garment/textile technologist    Social History Main Topics  . Smoking status: Former Smoker -- 2.00 packs/day for 39 years    Types: Cigarettes    Quit date: 03/14/2006  . Smokeless tobacco: Never Used  . Alcohol Use: Not on file  . Drug Use: Not on file  . Sexually Active: Not on file   Other Topics Concern  . Not on file   Social History Narrative  . No narrative on file     No Known Allergies   Outpatient Prescriptions Prior to Visit  Medication Sig Dispense Refill  . Ascorbic Acid (VITAMIN C) 1000 MG tablet Take 1,000 mg by mouth 2 (two) times daily.        Marland Kitchen aspirin 81 MG tablet Take 81 mg by mouth daily.        . budesonide-formoterol (SYMBICORT) 160-4.5 MCG/ACT inhaler Inhale 2 puffs into the lungs 2 (two) times daily.  1 Inhaler  4  . Calcium Carbonate-Vit D-Min (CALCIUM 1200 PO) Take 1 tablet by mouth daily.        . Multiple Vitamin (MULTIVITAMIN) tablet Take 1 tablet by mouth daily.         No facility-administered medications prior to visit.     Review of Systems  Constitutional:   No  weight loss, night sweats,  Fevers, chills, HEENT:   No headaches,  Difficulty swallowing,  Tooth/dental problems,  Sore throat,                No sneezing, itching, ear ache, nasal congestion, post nasal drip,   CV:  No chest pain,  Orthopnea, PND, swelling in lower extremities, anasarca, dizziness, palpitations  GI  No heartburn, indigestion, abdominal pain, nausea, vomiting, diarrhea, change in bowel habits, loss of appetite  Resp: Notes shortness of breath with exertion not at rest.  No excess mucus, no productive cough,  No non-productive cough,  No coughing up of blood.  No change in color of mucus.  No wheezing.  No chest wall deformity  Skin: no rash or lesions.  GU: no dysuria, change in color of urine, no urgency or frequency.  No flank pain.  MS:  No joint pain or swelling.  No  decreased range of motion.  No back pain.  Psych:  No change in mood or affect. No depression or anxiety.  No memory loss.     Objective:   Physical Exam  Filed Vitals:   08/28/12 1500  BP: 120/82  Pulse: 66  Temp: 97.6 F (36.4 C)  TempSrc: Oral  Height: 5\' 9"  (1.753 m)  Weight: 161 lb (73.029 kg)  SpO2: 91%    Gen: Pleasant, well-nourished, in no distress,  normal affect  ENT: No lesions,  mouth clear,  oropharynx clear, no postnasal drip  Neck: No JVD, no TMG, no carotid bruits  Lungs: No use of accessory muscles, no dullness to percussion, distant BS  Cardiovascular: RRR, heart sounds normal, no murmur or gallops, no peripheral edema  Abdomen: soft and NT, no HSM,  BS normal  Musculoskeletal: No deformities, no cyanosis  or clubbing  Neuro: alert, non focal  Skin: Warm, no lesions or rashes        Assessment & Plan:   COPD gold stage C. Gold stage C. COPD stable at this time Plan Continue inhaled medications as prescribed       Updated Medication List Outpatient Encounter Prescriptions as of 08/28/2012  Medication Sig Dispense Refill  . Ascorbic Acid (VITAMIN C) 1000 MG tablet Take 1,000 mg by mouth 2 (two) times daily.        Marland Kitchen aspirin 81 MG tablet Take 81 mg by mouth daily.        . budesonide-formoterol (SYMBICORT) 160-4.5 MCG/ACT inhaler Inhale 2 puffs into the lungs 2 (two) times daily.  1 Inhaler  4  . Calcium Carbonate-Vit D-Min (CALCIUM 1200 PO) Take 1 tablet by mouth daily.        . Multiple Vitamin (MULTIVITAMIN) tablet Take 1 tablet by mouth daily.        Marland Kitchen albuterol (PROVENTIL HFA;VENTOLIN HFA) 108 (90 BASE) MCG/ACT inhaler Inhale 2 puffs into the lungs every 6 (six) hours as needed for wheezing.  1 Inhaler  6   No facility-administered encounter medications on file as of 08/28/2012.

## 2012-08-28 NOTE — Patient Instructions (Addendum)
No change in inhaled medications USe albuterol as needed for shortness of breath Stay on Symbicort two puff twice daily Return 6 months

## 2012-08-28 NOTE — Assessment & Plan Note (Signed)
Gold stage C. COPD stable at this time Plan Continue inhaled medications as prescribed

## 2013-01-07 ENCOUNTER — Other Ambulatory Visit: Payer: Self-pay | Admitting: Critical Care Medicine

## 2013-02-25 ENCOUNTER — Encounter: Payer: Self-pay | Admitting: Critical Care Medicine

## 2013-02-25 ENCOUNTER — Ambulatory Visit (INDEPENDENT_AMBULATORY_CARE_PROVIDER_SITE_OTHER): Payer: BC Managed Care – PPO | Admitting: Critical Care Medicine

## 2013-02-25 VITALS — BP 142/80 | HR 86 | Temp 97.5°F | Ht 69.0 in | Wt 160.0 lb

## 2013-02-25 DIAGNOSIS — J449 Chronic obstructive pulmonary disease, unspecified: Secondary | ICD-10-CM

## 2013-02-25 MED ORDER — BUDESONIDE-FORMOTEROL FUMARATE 160-4.5 MCG/ACT IN AERO: INHALATION_SPRAY | RESPIRATORY_TRACT | Status: DC

## 2013-02-25 NOTE — Progress Notes (Signed)
Subjective:    Patient ID: Russell Schaefer, male    DOB: 22-Oct-1950, 62 y.o.   MRN: [id number]  HPI  62 y.o. WM with Golds Stage C  copd FeV1 45 % prd                                                                                                                            08/28/2012 there are no new complaints. The patient's dyspnea levels the same. The patient struggling working in the heat. There is no excess cough. There is no chest discomfort.  Pt denies any significant sore throat, nasal congestion or excess secretions, fever, chills, sweats, unintended weight loss, pleurtic or exertional chest pain, orthopnea PND, or leg swelling Pt denies any increase in rescue therapy over baseline, denies waking up needing it or having any early am or nocturnal exacerbations of coughing/wheezing/or dyspnea. Pt also denies any obvious fluctuation in symptoms with  weather or environmental change or other alleviating or aggravating factors      02/25/2013 Chief Complaint  Patient presents with  . COPD    Breathing is unchanged. Reports DOE and slight coughing. Denies chest tightness or wheezing.  No real change in dyspnea.  No real cough.  No real chest pain .  No wheezing.  Pt denies any significant sore throat, nasal congestion or excess secretions, fever, chills, sweats, unintended weight loss, pleurtic or exertional chest pain, orthopnea PND, or leg swelling Pt denies any increase in rescue therapy over baseline, denies waking up needing it or having any early am or nocturnal exacerbations of coughing/wheezing/or dyspnea. Pt also denies any obvious fluctuation in symptoms with  weather or environmental change or other alleviating or aggravating factors  On symbicort  Past Medical History  Diagnosis Date  . COPD (chronic obstructive pulmonary disease)      Family History  Problem Relation Age of Onset  . COPD       History   Social History  . Marital Status: Married    Spouse Name: N/A    Number of Children: N/A  . Years of Education: N/A   Occupational History  . Garment/textile technologist    Social History Main Topics  . Smoking status: Former Smoker -- 2.00 packs/day for 39 years    Types: Cigarettes    Quit date: 03/14/2006  . Smokeless tobacco: Never Used  . Alcohol Use: Not on file  . Drug Use: Not on file  . Sexual Activity: Not on file   Other Topics Concern  . Not on file   Social History Narrative  . No narrative on file     No Known Allergies   Outpatient Prescriptions Prior to Visit  Medication Sig Dispense Refill  . albuterol (PROVENTIL HFA;VENTOLIN HFA) 108 (90 BASE) MCG/ACT inhaler Inhale 2 puffs into the lungs every 6 (six) hours as needed for wheezing.  1 Inhaler  6  . Ascorbic Acid (VITAMIN C) 1000 MG tablet Take 1,000 mg by mouth 2 (two) times daily.        Marland Kitchen aspirin 81 MG tablet Take 81 mg by mouth daily.        . Calcium Carbonate-Vit D-Min (CALCIUM 1200 PO) Take 1 tablet by mouth daily.        . Multiple Vitamin (MULTIVITAMIN) tablet Take 1 tablet by mouth daily.        . SYMBICORT 160-4.5 MCG/ACT inhaler Inhale 2 puffs into the lungs 2 (two) times daily.  10.2 g  6   No facility-administered medications prior to visit.     Review of Systems  Constitutional:   No  weight loss, night sweats,  Fevers, chills, HEENT:   No headaches,  Difficulty swallowing,  Tooth/dental problems,  Sore throat,                No sneezing, itching, ear ache, nasal congestion, post nasal drip,   CV:  No chest pain,   Orthopnea, PND, swelling in lower extremities, anasarca, dizziness, palpitations  GI  No heartburn, indigestion, abdominal pain, nausea, vomiting, diarrhea, change in bowel habits, loss of appetite  Resp: Notes shortness of breath with exertion not at rest.  No excess mucus, no productive cough,  No non-productive cough,  No coughing up of blood.  No change in color of mucus.  No wheezing.  No chest wall deformity  Skin: no rash or lesions.  GU: no dysuria, change in color of urine, no urgency or frequency.  No flank pain.  MS:  No joint pain or swelling.  No decreased range of motion.  No back pain.  Psych:  No change in mood or affect. No depression or anxiety.  No memory loss.     Objective:   Physical Exam  Filed Vitals:   02/25/13 1531 02/25/13 1532  BP:  142/80  Pulse:  86  Temp: 97.5 F (36.4 C)   TempSrc: Oral   Height: 5\' 9"  (1.753 m)   Weight: 160 lb (72.576 kg)   SpO2:  92%    Gen: Pleasant, well-nourished, in no distress,  normal affect  ENT: No lesions,  mouth clear,  oropharynx clear, no postnasal drip  Neck: No JVD, no TMG, no carotid bruits  Lungs: No use of  accessory muscles, no dullness to percussion, distant BS  Cardiovascular: RRR, heart sounds normal, no murmur or gallops, no peripheral edema  Abdomen: soft and NT, no HSM,  BS normal  Musculoskeletal: No deformities, no cyanosis or clubbing  Neuro: alert, non focal  Skin: Warm, no lesions or rashes        Assessment & Plan:   COPD gold stage C. Gold stage C. COPD and an ex-smoker stable at this time Plan Maintain inhaled medications as prescribed Administer flu vaccine      Updated Medication List Outpatient Encounter Prescriptions as of 02/25/2013  Medication Sig  . albuterol (PROVENTIL HFA;VENTOLIN HFA) 108 (90 BASE) MCG/ACT inhaler Inhale 2 puffs into the lungs every 6 (six) hours as needed for wheezing.  . Ascorbic Acid (VITAMIN C) 1000 MG tablet Take 1,000 mg by mouth 2  (two) times daily.    Marland Kitchen aspirin 81 MG tablet Take 81 mg by mouth daily.    . budesonide-formoterol (SYMBICORT) 160-4.5 MCG/ACT inhaler Inhale 2 puffs into the lungs 2 (two) times daily.  . Calcium Carbonate-Vit D-Min (CALCIUM 1200 PO) Take 1 tablet by mouth daily.    . Multiple Vitamin (MULTIVITAMIN) tablet Take 1 tablet by mouth daily.    . [DISCONTINUED] SYMBICORT 160-4.5 MCG/ACT inhaler Inhale 2 puffs into the lungs 2 (two) times daily.

## 2013-02-25 NOTE — Patient Instructions (Signed)
No change in medications Flu vaccine today Return 6 months

## 2013-02-26 NOTE — Assessment & Plan Note (Signed)
Gold stage C. COPD and an ex-smoker stable at this time Plan Maintain inhaled medications as prescribed Administer flu vaccine

## 2013-08-16 ENCOUNTER — Encounter: Payer: Self-pay | Admitting: Critical Care Medicine

## 2013-08-16 ENCOUNTER — Ambulatory Visit (INDEPENDENT_AMBULATORY_CARE_PROVIDER_SITE_OTHER): Payer: BC Managed Care – PPO | Admitting: Critical Care Medicine

## 2013-08-16 VITALS — BP 144/86 | HR 62 | Ht 69.0 in | Wt 162.2 lb

## 2013-08-16 DIAGNOSIS — J449 Chronic obstructive pulmonary disease, unspecified: Secondary | ICD-10-CM

## 2013-08-16 NOTE — Assessment & Plan Note (Signed)
Gold C Copd exsmoker Plan Cont symbicort Note pfts unchanged

## 2013-08-16 NOTE — Progress Notes (Signed)
Subjective:    Patient ID: Russell Schaefer, male    DOB: December 18, 1950, 63 y.o.   MRN: [id number]  HPI  63 y.o. WM with Golds Stage C  copd FeV1 45 % prd                                                                                                                             08/16/2013 Chief Complaint  Patient presents with  . 6 month follow up    DOE is unchanged.  No cough or chest tightness/pain.  No real changes, dyspnea is the same.  No real cough. No chest pain.  No wheezes.  No qhs dyspnea.  No cigarettes.  Pt denies any significant sore throat, nasal congestion or excess secretions, fever, chills, sweats, unintended weight loss, pleurtic or exertional chest pain, orthopnea PND, or leg swelling Pt denies any increase in rescue therapy over baseline, denies waking up needing it or having any early am or nocturnal exacerbations of coughing/wheezing/or dyspnea. Pt also denies any obvious fluctuation in symptoms with  weather or environmental change or other alleviating or aggravating factors                                            Review of Systems  Constitutional:   No  weight loss, night sweats,  Fevers, chills, HEENT:   No headaches,  Difficulty swallowing,  Tooth/dental problems,  Sore throat,                No sneezing, itching, ear ache, nasal congestion, post nasal drip,   CV:  No cest pain,  Orthopnea, PND, swelling in lower extremities, anasarca, dizziness, palpitations  GI  No heartburn, indigestion, abdominal pain, nausea, vomiting, diarrhea, change in bowel habits, loss of appetite  Resp: Notes shortness of breath with exertion not at rest.  No excess mucus, no productive cough,  No non-productivecough,  No coughing up of blood.  No change in color of mucus.  No wheezing.  No chest wall deformity  Skin: no rash or lesions.  GU: no dysuria, change in color of urine, no urgency or frequency.  No flank pain.  MS:  No joint pain or swelling.  No decreased range of  motion.  No back pain.  Psych:  No change in mood or affect. No depression or anxiety.  No memory loss.     Objective:   Physical Exam  Filed Vitals:   08/16/13 1026  BP: 144/86  Pulse: 62  Height: 5\' 9"  (1.753 m)  Weight: 162 lb 3.2 oz (73.573 kg)  SpO2: 94%    Gen: Pleasant, well-nourished, in no distress,  normal affect  ENT: No lesions,  mouth clear,  oropharynx clear, no postnasal drip  Neck: No JVD, no TMG, no carotid bruits  Lungs: No use of accessory muscles, no dullness to  percussion, distant BS  Cardiovascular: RRR, heart sounds normal, no murmur or gallops, no peripheral edema  Abdomen: soft and NT, no HSM,  BS normal  Musculoskeletal: No deformities, no cyanosis or clubbing  Neuro: alert, non focal  Skin: Warm, no lesions or rashes        Assessment & Plan:   COPD gold stage C. Gold C Copd exsmoker Plan Cont symbicort Note pfts unchanged       Updated Medication List Outpatient Encounter Prescriptions as of 08/16/2013  Medication Sig  . albuterol (PROVENTIL HFA;VENTOLIN HFA) 108 (90 BASE) MCG/ACT inhaler Inhale 2 puffs into the lungs every 6 (six) hours as needed for wheezing.  . Ascorbic Acid (VITAMIN C) 1000 MG tablet Take 1,000 mg by mouth 2 (two) times daily.    Marland Kitchen aspirin 81 MG tablet Take 81 mg by mouth daily.    . budesonide-formoterol (SYMBICORT) 160-4.5 MCG/ACT inhaler Inhale 2 puffs into the lungs 2 (two) times daily.  . Calcium Carbonate-Vit D-Min (CALCIUM 1200 PO) Take 1 tablet by mouth daily.    . Multiple Vitamin (MULTIVITAMIN) tablet Take 1 tablet by mouth daily.

## 2013-08-16 NOTE — Patient Instructions (Signed)
No change in medications. Return in        6 months        

## 2014-02-24 ENCOUNTER — Ambulatory Visit (INDEPENDENT_AMBULATORY_CARE_PROVIDER_SITE_OTHER): Payer: BC Managed Care – PPO | Admitting: Critical Care Medicine

## 2014-02-24 ENCOUNTER — Encounter: Payer: Self-pay | Admitting: Critical Care Medicine

## 2014-02-24 VITALS — BP 122/80 | HR 67 | Temp 97.0°F | Ht 69.0 in | Wt 158.0 lb

## 2014-02-24 DIAGNOSIS — Z23 Encounter for immunization: Secondary | ICD-10-CM

## 2014-02-24 DIAGNOSIS — J209 Acute bronchitis, unspecified: Secondary | ICD-10-CM

## 2014-02-24 DIAGNOSIS — J441 Chronic obstructive pulmonary disease with (acute) exacerbation: Secondary | ICD-10-CM

## 2014-02-24 DIAGNOSIS — J440 Chronic obstructive pulmonary disease with acute lower respiratory infection: Principal | ICD-10-CM

## 2014-02-24 DIAGNOSIS — J0191 Acute recurrent sinusitis, unspecified: Secondary | ICD-10-CM

## 2014-02-24 MED ORDER — FLUTICASONE PROPIONATE 50 MCG/ACT NA SUSP: 2.0000 | Freq: Every day | NASAL | Status: DC

## 2014-02-24 MED ORDER — BUDESONIDE-FORMOTEROL FUMARATE 160-4.5 MCG/ACT IN AERO: INHALATION_SPRAY | RESPIRATORY_TRACT | Status: DC

## 2014-02-24 MED ORDER — ALBUTEROL SULFATE HFA 108 (90 BASE) MCG/ACT IN AERS: 2.0000 | INHALATION_SPRAY | Freq: Four times a day (QID) | RESPIRATORY_TRACT | Status: DC | PRN

## 2014-02-24 MED ORDER — NYSTATIN 100000 UNIT/ML MT SUSP: 10.0000 mL | Freq: Three times a day (TID) | OROMUCOSAL | Status: DC

## 2014-02-24 MED ORDER — PREDNISONE 10 MG PO TABS: ORAL_TABLET | ORAL | Status: DC

## 2014-02-24 MED ORDER — AZITHROMYCIN 250 MG PO TABS: ORAL_TABLET | ORAL | Status: DC

## 2014-02-24 NOTE — Patient Instructions (Signed)
Flu vaccine and Prevnar 13 given Prednisone 10mg  Take 4 for two days three for two days two for two days one for two days Azithromycin 250mg  Take two once then one daily until gone Flonase two puff daily each nostril (fluticasone) Refills on symbicort and albuterol sent Return 4 months

## 2014-02-24 NOTE — Progress Notes (Signed)
Subjective:    Patient ID: Russell Schaefer, male    DOB: [birth year], 63 y.o.   MRN: [id number]  HPI 63 y.o. WM with Golds Stage C  copd FeV1 45 % prd                                                                                                                             02/24/2014 Chief Complaint  Patient presents with  . 6 month follow up    Tx'd for bronchitis with pred and abx x 3 wks ago by Urgent Care; still coughing with mostly white mucus and has increased SOB.  No wheezing, chest tightness/CP, or f/c/s.  rx bronchitis 3 weeks ago for bronchitits : rx pred and 5 d abx.  Now not 100% better.  Symptoms : still coughing and more pndrip.  Notes sinus pressure bifrontal, no fever, no wheeze, notes some DOE, No QHS dyspnea or cough.  No real chest pain.  No edema in feet.  Mucus now is white                                           Review of Systems Constitutional:   No  weight loss, night sweats,  Fevers, chills, HEENT:   No headaches,  Difficulty swallowing,  Tooth/dental problems,  Sore throat,                No sneezing, itching, ear ache, +++nasal congestion,+++ post nasal drip,   CV:  No cest pain,  Orthopnea, PND, swelling in lower extremities, anasarca, dizziness, palpitations  GI  No heartburn, indigestion, abdominal pain, nausea, vomiting, diarrhea, change in bowel habits, loss of appetite  Resp: Notes shortness of breath with exertion not at rest.  Notes  excess mucus, notes  productive cough,  No non-productivecough,  No coughing up of blood.  No change in color of mucus.  No wheezing.  No chest wall deformity  Skin: no rash or lesions.  GU: no dysuria, change in color of urine, no urgency or frequency.  No flank pain.  MS:  No joint pain or swelling.  No decreased range of motion.  No back pain.  Psych:  No change in mood or affect. No depression or anxiety.  No memory loss.     Objective:   Physical Exam Filed Vitals:   02/24/14 0957  BP: 122/80  Pulse:  67  Temp: 97 F (36.1 C)  TempSrc: Oral  Height: 5\' 9"  (1.753 m)  Weight: 158 lb (71.668 kg)  SpO2: 92%    Gen: Pleasant, well-nourished, in no distress,  normal affect  ENT: No lesions,  mouth clear,  oropharynx clear,+++ postnasal drip, bilateral nasal purulence  Neck: No JVD, no TMG, no carotid bruits  Lungs: No use of accessory muscles, no dullness to percussion, distant BS  Cardiovascular: RRR, heart sounds normal,  no murmur or gallops, no peripheral edema  Abdomen: soft and NT, no HSM,  BS normal  Musculoskeletal: No deformities, no cyanosis or clubbing  Neuro: alert, non focal  Skin: Warm, no lesions or rashes        Assessment & Plan:   COPD with acute bronchitis Acute tracheobronchitis persisting with mild sinusitis and copd flare Plan Flu vaccine and Prevnar 13 given Prednisone 10mg  Take 4 for two days three for two days two for two days one for two days Azithromycin 250mg  Take two once then one daily until gone Flonase two puff daily each nostril (fluticasone) Refills on symbicort and albuterol sent Return 4 months       Updated Medication List Outpatient Encounter Prescriptions as of 02/24/2014  Medication Sig  . albuterol (PROVENTIL HFA;VENTOLIN HFA) 108 (90 BASE) MCG/ACT inhaler Inhale 2 puffs into the lungs every 6 (six) hours as needed for wheezing.  . Ascorbic Acid (VITAMIN C) 1000 MG tablet Take 1,000 mg by mouth 2 (two) times daily.    Marland Kitchen. aspirin 81 MG tablet Take 81 mg by mouth daily.    . budesonide-formoterol (SYMBICORT) 160-4.5 MCG/ACT inhaler Inhale 2 puffs into the lungs 2 (two) times daily.  . Calcium Carbonate-Vit D-Min (CALCIUM 1200 PO) Take 1 tablet by mouth daily.    . Multiple Vitamin (MULTIVITAMIN) tablet Take 1 tablet by mouth daily.    . tamsulosin (FLOMAX) 0.4 MG CAPS capsule Take 0.4 mg by mouth daily.  . [DISCONTINUED] albuterol (PROVENTIL HFA;VENTOLIN HFA) 108 (90 BASE) MCG/ACT inhaler Inhale 2 puffs into the lungs every 6  (six) hours as needed for wheezing.  . [DISCONTINUED] budesonide-formoterol (SYMBICORT) 160-4.5 MCG/ACT inhaler Inhale 2 puffs into the lungs 2 (two) times daily.  Marland Kitchen. azithromycin (ZITHROMAX) 250 MG tablet Take two once then one daily until gone  . fluticasone (FLONASE) 50 MCG/ACT nasal spray Place 2 sprays into both nostrils daily.  Marland Kitchen. nystatin (MYCOSTATIN) 100000 UNIT/ML suspension Take 10 mLs (1,000,000 Units total) by mouth 3 (three) times daily. Swish, gargle, and expectorate 10 mLs three times daily  . predniSONE (DELTASONE) 10 MG tablet Take 4 for two days three for two days two for two days one for two days

## 2014-02-25 NOTE — Assessment & Plan Note (Signed)
Acute tracheobronchitis persisting with mild sinusitis and copd flare Plan Flu vaccine and Prevnar 13 given Prednisone 10mg  Take 4 for two days three for two days two for two days one for two days Azithromycin 250mg  Take two once then one daily until gone Flonase two puff daily each nostril (fluticasone) Refills on symbicort and albuterol sent Return 4 months

## 2014-04-10 ENCOUNTER — Telehealth: Payer: Self-pay | Admitting: Critical Care Medicine

## 2014-04-10 NOTE — Telephone Encounter (Signed)
PA initiated through [email or web address] Received notice we should hear within 3 days. # to contact is [phone]  ID # [id number]. Spouse aware. Will forward to CJ to f/u on

## 2014-04-15 MED ORDER — FLUTICASONE FUROATE-VILANTEROL 100-25 MCG/INH IN AEPB: 1.0000 | INHALATION_SPRAY | Freq: Every day | RESPIRATORY_TRACT | Status: DC

## 2014-04-15 NOTE — Telephone Encounter (Signed)
Received denial letter for Symbicort.   Per Fax, Symbicort denied because no documentation of pt failing two covered alternatives.  Pt has tried Advair, but has not tried [employer], materials.   Dr. Delford Field, can Symbicort be changed to [facility]?

## 2014-04-15 NOTE — Telephone Encounter (Signed)
Spoke with pt's wife and advised that we sent in rx for Breo  In place of Symbicort per insurance requirements.  PT to call our office if any issues with new medications.

## 2014-04-15 NOTE — Telephone Encounter (Signed)
Yes breo 100 one puff daily

## 2014-05-08 ENCOUNTER — Telehealth: Payer: Self-pay | Admitting: Critical Care Medicine

## 2014-05-08 NOTE — Telephone Encounter (Signed)
Attempted to call pt's wife. Line was busy. Will try back.

## 2014-05-09 NOTE — Telephone Encounter (Signed)
atc pt, line busy X2.  Wcb.

## 2014-05-12 NOTE — Telephone Encounter (Signed)
[phone] calling back

## 2014-05-12 NOTE — Telephone Encounter (Signed)
Spoke with pt's wife, states that pt has had worsening breathing since starting breo- has been using the leftover symbicort.  Wife states that pt has also tried advair in the past which was ineffective for him.  Pt and wife are wanting to know if there is another medication that can be prescribed instead or if we can proceed to try a PA for symbicort since he's tried and failed 2 meds.  Dr. Delford Field please advise.  Thanks!

## 2014-05-12 NOTE — Telephone Encounter (Signed)
Go for the PA tried and failed breo/advair

## 2014-05-13 NOTE — Telephone Encounter (Signed)
Spoke with pt wife. Aware that PA will be done for the coverage of Symbicort. Wife asks that if we call with update and she does not answer to call the patient at work # [phone] (W). --------- Called pharmacy to request PA information be faxed Pharmacy not open.  Will call back later

## 2014-05-14 NOTE — Telephone Encounter (Signed)
Called and spoke to PA rep. PA form cannot be initiated over the phone-form faxed to front fax. Will await fax.

## 2014-05-15 NOTE — Telephone Encounter (Signed)
Fax never received  Called [employer] at [phone]  Pt ID # [id number]  They will fax the form and I will wait for it (up front fax machine) Fax received  Form filled out and placed in PW's lookat  Will forward to CJ to f/u on Thanks

## 2014-05-19 MED ORDER — BUDESONIDE-FORMOTEROL FUMARATE 160-4.5 MCG/ACT IN AERO: 2.0000 | INHALATION_SPRAY | Freq: Two times a day (BID) | RESPIRATORY_TRACT | Status: DC

## 2014-05-19 NOTE — Telephone Encounter (Signed)
Symbicort PA faxed.  Form placed in triage.  Will call wife to update on status.

## 2014-05-19 NOTE — Telephone Encounter (Signed)
Spoke with wife.  She is aware Symbicort PA faxed back today. 1 sample of Symbicort 160 placed at [facility] office for pick up as pt will run out of medication within the next few days. Wife aware and states they will come by for sample. Will await PA approval/denial.

## 2014-05-19 NOTE — Telephone Encounter (Signed)
Crystal, please advise if this has been signed or faxed back. Thanks.

## 2014-05-19 NOTE — Telephone Encounter (Signed)
Pt wife call back to check on status of forms, says that husband is bout out of his med.Caren Griffins

## 2014-05-22 NOTE — Telephone Encounter (Signed)
Called [employer] to check on status of PA for Symbicort [phone]) Spoke with Glee Arvin D, they never received the form. This has been refaxed again to [phone] Will send to Crystal to follow up on.

## 2014-05-23 NOTE — Telephone Encounter (Signed)
PA was initiated:    They stated that they did receive the form on 05/22/14 and it can take up to 7 business days to review and send out a letter on approval or denial.  Will hold this in the PA folder in the back to follow up on.   Will forward message to CJ to make her aware.

## 2014-05-27 NOTE — Telephone Encounter (Signed)
Crystal, have you received anything on this pt? thanks

## 2014-05-27 NOTE — Telephone Encounter (Signed)
Nothing has been received as of today. Will forward back to Crystal to follow up on.

## 2014-05-27 NOTE — Telephone Encounter (Signed)
Symbicort has been approved by [employer].  Approved 04/10/2014-03/13/2038.   Called [facility] to make them aware.    Forwarding to [employer] as fyi.  Nothing further needed.

## 2014-05-27 NOTE — Telephone Encounter (Signed)
I have not received anything unless it is in Dr. Lynelle Doctor look at.

## 2014-05-28 NOTE — Telephone Encounter (Signed)
Wife aware of Symbicort approval.  Nothing further needed.

## 2014-06-25 ENCOUNTER — Ambulatory Visit: Payer: Self-pay | Admitting: Critical Care Medicine

## 2014-07-30 ENCOUNTER — Encounter: Payer: Self-pay | Admitting: Critical Care Medicine

## 2014-07-30 ENCOUNTER — Ambulatory Visit (INDEPENDENT_AMBULATORY_CARE_PROVIDER_SITE_OTHER): Payer: BLUE CROSS/BLUE SHIELD | Admitting: Critical Care Medicine

## 2014-07-30 VITALS — BP 118/74 | HR 73 | Temp 97.8°F | Wt 161.0 lb

## 2014-07-30 DIAGNOSIS — J209 Acute bronchitis, unspecified: Secondary | ICD-10-CM

## 2014-07-30 DIAGNOSIS — J441 Chronic obstructive pulmonary disease with (acute) exacerbation: Secondary | ICD-10-CM | POA: Diagnosis not present

## 2014-07-30 DIAGNOSIS — J440 Chronic obstructive pulmonary disease with acute lower respiratory infection: Principal | ICD-10-CM

## 2014-07-30 MED ORDER — LEVOFLOXACIN 500 MG PO TABS: 500.0000 mg | ORAL_TABLET | Freq: Every day | ORAL | Status: DC

## 2014-07-30 MED ORDER — METHYLPREDNISOLONE ACETATE 80 MG/ML IJ SUSP
120.0000 mg | Freq: Once | INTRAMUSCULAR | Status: AC
  Administered 2014-07-30: 120 mg via INTRAMUSCULAR

## 2014-07-30 NOTE — Progress Notes (Signed)
Subjective:    Patient ID: Russell Schaefer, male    DOB: 08/29/1950, 64 y.o.   MRN: [id number]  HPI 07/30/2014 Chief Complaint  Patient presents with  . Follow-up    COPD; brochitis at this time; taking Prednisone and Doxycycline from PCP; chest pain and back pain from coughing; prod cough w/yellow mucus    More bronchitis last week, pt went to urgent care. Rx pred and doxy.  Now off pred x 1 day, a few more days on doxy.  Not really any better.  Pt sill coughs, pain in L back , hurts with cough.  Pt had a CXR and was neg for PNA Pt denies any significant sore throat, nasal congestion or excess secretions, fever, chills, sweats, unintended weight loss, pleurtic or exertional chest pain, orthopnea PND, or leg swelling Pt denies any increase in rescue therapy over baseline, denies waking up needing it or having any early am or nocturnal exacerbations of coughing/wheezing/or dyspnea. Pt also denies any obvious fluctuation in symptoms with  weather or environmental change or other alleviating or aggravating factors   Current Medications, Allergies, Complete Past Medical History, Past Surgical History, Family History, and Social History were reviewed in [employer] record.  Past Medical History  Diagnosis Date  . COPD (chronic obstructive pulmonary disease)      Family History  Problem Relation Age of Onset  . COPD       History   Social History  . Marital Status: Married    Spouse Name: N/A  . Number of Children: N/A  . Years of Education: N/A   Occupational History  . Garment/textile technologist    Social History Main Topics  . Smoking status: Former Smoker -- 2.00 packs/day for 39 years    Types: Cigarettes    Quit date: 03/14/2006  . Smokeless tobacco: Never Used  . Alcohol Use: Not on file  . Drug Use: Not on file  . Sexual Activity: Not on file   Other Topics Concern  . Not on file   Social History Narrative     No Known Allergies    Outpatient Prescriptions Prior to Visit  Medication Sig Dispense Refill  . albuterol (PROVENTIL HFA;VENTOLIN HFA) 108 (90 BASE) MCG/ACT inhaler Inhale 2 puffs into the lungs every 6 (six) hours as needed for wheezing. 1 Inhaler 6  . Ascorbic Acid (VITAMIN C) 1000 MG tablet Take 1,000 mg by mouth 2 (two) times daily.      Marland Kitchen aspirin 81 MG tablet Take 81 mg by mouth daily.      . budesonide-formoterol (SYMBICORT) 160-4.5 MCG/ACT inhaler Inhale 2 puffs into the lungs 2 (two) times daily. 1 Inhaler 0  . Calcium Carbonate-Vit D-Min (CALCIUM 1200 PO) Take 1 tablet by mouth daily.      . fluticasone (FLONASE) 50 MCG/ACT nasal spray Place 2 sprays into both nostrils daily. 16 g 2  . Multiple Vitamin (MULTIVITAMIN) tablet Take 1 tablet by mouth daily.      . tamsulosin (FLOMAX) 0.4 MG CAPS capsule Take 0.4 mg by mouth daily.    Marland Kitchen azithromycin (ZITHROMAX) 250 MG tablet Take two once then one daily until gone (Patient not taking: Reported on 07/30/2014) 6 tablet 0  . Fluticasone Furoate-Vilanterol 100-25 MCG/INH AEPB Inhale 1 puff into the lungs daily. (Patient not taking: Reported on 07/30/2014) 1 each 6  . nystatin (MYCOSTATIN) 100000 UNIT/ML suspension Take 10 mLs (1,000,000 Units total) by mouth 3 (three) times daily. Swish, gargle, and  expectorate 10 mLs three times daily (Patient not taking: Reported on 07/30/2014) 150 mL 0  . predniSONE (DELTASONE) 10 MG tablet Take 4 for two days three for two days two for two days one for two days (Patient not taking: Reported on 07/30/2014) 20 tablet 0   No facility-administered medications prior to visit.    Review of Systems  Constitutional: Negative for fever, chills, diaphoresis, activity change, appetite change, fatigue and unexpected weight change.  HENT: Negative for congestion, dental problem, ear discharge, ear pain, facial swelling, hearing loss, mouth sores, nosebleeds, postnasal drip, rhinorrhea, sinus pressure, sneezing, sore throat, tinnitus, trouble  swallowing and voice change.   Eyes: Negative for photophobia, discharge, itching and visual disturbance.  Respiratory: Positive for cough, shortness of breath and wheezing. Negative for apnea, choking, chest tightness and stridor.   Cardiovascular: Negative for chest pain, palpitations and leg swelling.  Gastrointestinal: Negative for nausea, vomiting, abdominal pain, constipation, blood in stool and abdominal distention.  Genitourinary: Negative for dysuria, urgency, frequency, hematuria, flank pain, decreased urine volume and difficulty urinating.  Musculoskeletal: Negative for myalgias, back pain, joint swelling, arthralgias, gait problem, neck pain and neck stiffness.  Skin: Negative for color change, pallor and rash.  Neurological: Negative for dizziness, tremors, seizures, syncope, speech difficulty, weakness, light-headedness, numbness and headaches.  Hematological: Negative for adenopathy. Does not bruise/bleed easily.  Psychiatric/Behavioral: Negative for confusion, sleep disturbance and agitation. The patient is not nervous/anxious.        Objective:   Physical Exam  Constitutional: He appears well-developed and well-nourished. He is active.  Non-toxic appearance. He does not appear ill. No distress.  HENT:  Head: Normocephalic and atraumatic.  Nose: No mucosal edema, rhinorrhea, sinus tenderness, nasal deformity or septal deviation. No epistaxis. Right sinus exhibits no maxillary sinus tenderness and no frontal sinus tenderness. Left sinus exhibits no maxillary sinus tenderness and no frontal sinus tenderness.  Mouth/Throat: Oropharynx is clear and moist. No oropharyngeal exudate.  Eyes: Conjunctivae and EOM are normal. Pupils are equal, round, and reactive to light. Right eye exhibits no discharge. Left eye exhibits no discharge. No scleral icterus.  Neck: Normal range of motion. Neck supple. Normal carotid pulses, no hepatojugular reflux and no JVD present. No tracheal tenderness  and no muscular tenderness present. Carotid bruit is not present. No rigidity. No tracheal deviation, no edema, no erythema and normal range of motion present. No thyroid mass and no thyromegaly present.  Cardiovascular: Normal rate, regular rhythm, S1 normal, S2 normal, normal heart sounds, intact distal pulses and normal pulses.  PMI is not displaced.  Exam reveals no gallop, no S3, no S4, no distant heart sounds and no friction rub.   No murmur heard.  No systolic murmur is present   No diastolic murmur is present  Pulmonary/Chest: No accessory muscle usage or stridor. No apnea and no tachypnea. No respiratory distress. He has decreased breath sounds in the right lower field and the left lower field. He has wheezes in the right lower field and the left lower field. He has no rhonchi. He has no rales. Chest wall is not dull to percussion. He exhibits no mass, no tenderness, no bony tenderness and no deformity.  Abdominal: Soft. Normal appearance and bowel sounds are normal. He exhibits no distension, no ascites and no mass. There is no hepatosplenomegaly. There is no tenderness. There is no rigidity, no rebound, no guarding and no CVA tenderness.  Musculoskeletal: Normal range of motion.  Lymphadenopathy:       Head (  right side): No submental and no submandibular adenopathy present.       Head (left side): No submental and no submandibular adenopathy present.    He has no cervical adenopathy.    He has no axillary adenopathy.  Neurological: He is alert. He has normal strength and normal reflexes. No cranial nerve deficit or sensory deficit.  Skin: Skin is warm and dry. No bruising, no ecchymosis, no lesion and no rash noted. He is not diaphoretic. No cyanosis or erythema. No pallor. Nails show no clubbing.  Psychiatric: He has a normal mood and affect. His speech is normal and behavior is normal.  Vitals reviewed.         Assessment & Plan:  I personally reviewed all images and lab data in  the [facility] system as well as any outside material available during this office visit and agree with the  radiology impressions.  I also have reviewed any data /notes/records if available in care everywhere.  COPD with acute bronchitis Copd with Stage C, frequent exacerbations.   Plan  depomedrol  injection was given  levaquin one daily for 5 days Stop doxycycline Stay on symbicort  Return 3 month    Russell Schaefer was seen today for follow-up.  Diagnoses and all orders for this visit:  COPD with acute bronchitis Orders: -     methylPREDNISolone acetate (DEPO-MEDROL) injection 120 mg; Inject 1.5 mLs (120 mg total) into the muscle once.  Other orders -     levofloxacin (LEVAQUIN) 500 MG tablet; Take 1 tablet (500 mg total) by mouth daily.    I

## 2014-07-30 NOTE — Patient Instructions (Signed)
A depomedrol 120mg  injection was given Take levaquin one daily for 5 days Stop doxycycline Stay on symbicort  Return 3 months

## 2014-07-30 NOTE — Assessment & Plan Note (Signed)
Copd with Stage C, frequent exacerbations.   Plan  depomedrol 120mg  injection was given  levaquin one daily for 5 days Stop doxycycline Stay on symbicort  Return 3 month

## 2014-11-12 DIAGNOSIS — J439 Emphysema, unspecified: Secondary | ICD-10-CM | POA: Insufficient documentation

## 2014-11-12 DIAGNOSIS — N400 Enlarged prostate without lower urinary tract symptoms: Secondary | ICD-10-CM | POA: Insufficient documentation

## 2014-11-19 ENCOUNTER — Ambulatory Visit (INDEPENDENT_AMBULATORY_CARE_PROVIDER_SITE_OTHER): Payer: BLUE CROSS/BLUE SHIELD | Admitting: Critical Care Medicine

## 2014-11-19 ENCOUNTER — Encounter: Payer: Self-pay | Admitting: Critical Care Medicine

## 2014-11-19 VITALS — BP 132/70 | HR 70 | Temp 98.5°F | Ht 69.0 in | Wt 157.0 lb

## 2014-11-19 DIAGNOSIS — J440 Chronic obstructive pulmonary disease with acute lower respiratory infection: Secondary | ICD-10-CM

## 2014-11-19 DIAGNOSIS — J449 Chronic obstructive pulmonary disease, unspecified: Secondary | ICD-10-CM

## 2014-11-19 DIAGNOSIS — J209 Acute bronchitis, unspecified: Secondary | ICD-10-CM

## 2014-11-19 DIAGNOSIS — I447 Left bundle-branch block, unspecified: Secondary | ICD-10-CM | POA: Diagnosis not present

## 2014-11-19 DIAGNOSIS — J441 Chronic obstructive pulmonary disease with (acute) exacerbation: Secondary | ICD-10-CM

## 2014-11-19 NOTE — Patient Instructions (Signed)
No change in medications We will get you in with cardiology if you desire Flu vaccine will be given  Return 6 months

## 2014-11-19 NOTE — Progress Notes (Signed)
Subjective:    Patient ID: Russell Schaefer, male    DOB: [birth year], 64 y.o.   MRN: [id number]  HPI 11/19/2014 Chief Complaint  Patient presents with  . 3 month follow up    Pt states he has improved since last OV. Pt states his breathing is still not back to baseline. Pt c/o dry cough. Pt denies CP/tightness.     Dyspnea is the same compared to last OV.  Notes a dry cough.  Notes wheezing. No qhs dyspnea.  No chest pain.   pcp did an ECG  LBBB shown on ECG.   Pt denies any significant sore throat, nasal congestion or excess secretions, fever, chills, sweats, unintended weight loss, pleurtic or exertional chest pain, orthopnea PND, or leg swelling Pt denies any increase in rescue therapy over baseline, denies waking up needing it or having any early am or nocturnal exacerbations of coughing/wheezing/or dyspnea. Pt also denies any obvious fluctuation in symptoms with  weather or environmental change or other alleviating or aggravating factors   Current Medications, Allergies, Complete Past Medical History, Past Surgical History, Family History, and Social History were reviewed in [employer] [facility] electronic medical record per todays encounter:  11/19/2014  Review of Systems  Constitutional: Negative.   HENT: Negative.  Negative for ear pain, postnasal drip, rhinorrhea, sinus pressure, sore throat, trouble swallowing and voice change.   Eyes: Negative.   Respiratory: Positive for shortness of breath. Negative for apnea, cough, choking, chest tightness, wheezing and stridor.   Cardiovascular: Negative.  Negative for chest pain, palpitations and leg swelling.  Gastrointestinal: Negative.  Negative for nausea, vomiting, abdominal pain and abdominal distention.  Genitourinary: Negative.   Musculoskeletal: Negative.  Negative for myalgias and arthralgias.  Skin: Negative.  Negative for rash.  Allergic/Immunologic: Negative.  Negative for environmental allergies and food allergies.    Neurological: Negative.  Negative for dizziness, syncope, weakness and headaches.  Hematological: Negative.  Negative for adenopathy. Does not bruise/bleed easily.  Psychiatric/Behavioral: Negative.  Negative for sleep disturbance and agitation. The patient is not nervous/anxious.        Objective:   Physical Exam Filed Vitals:   11/19/14 1101  BP: 132/70  Pulse: 70  Temp: 98.5 F (36.9 C)  TempSrc: Oral  Height:  (1.753 m)  Weight: 157 lb (71.215 kg)  SpO2: 93%    Gen: Pleasant, well-nourished, in no distress,  normal affect  ENT: No lesions,  mouth clear,  oropharynx clear, no postnasal drip  Neck: No JVD, no TMG, no carotid bruits  Lungs: No use of accessory muscles, no dullness to percussion, distant BS  Cardiovascular: RRR, heart sounds normal, no murmur or gallops, no peripheral edema  Abdomen: soft and NT, no HSM,  BS normal  Musculoskeletal: No deformities, no cyanosis or clubbing  Neuro: alert, non focal  Skin: Warm, no lesions or rashes  No results found.        Assessment & Plan:  I personally reviewed all images and lab data in the [facility] system as well as any outside material available during this office visit and agree with the  radiology impressions.   COPD with acute bronchitis Chronic obstructive lung disease with dyspnea that appears to be out of proportion to patient's pulmonary physiology Recent acute onset left bundle branch block on EKG without specific chest pain symptoms Plan The patient is attempting to get cardiology visit in [facility] if that is not achieved I told the patient we would assist in getting him in  to see [facility] in [place] Continued inhaled medications as prescribed Flu vaccine was given   Rodderick was seen today for 3 month follow up.  Diagnoses and all orders for this visit:  COPD with chronic bronchitis  LBBB (left bundle branch block)  COPD with acute bronchitis

## 2014-11-20 ENCOUNTER — Telehealth: Payer: Self-pay | Admitting: Critical Care Medicine

## 2014-11-20 DIAGNOSIS — I447 Left bundle-branch block, unspecified: Secondary | ICD-10-CM

## 2014-11-20 NOTE — Assessment & Plan Note (Signed)
Chronic obstructive lung disease with dyspnea that appears to be out of proportion to patient's pulmonary physiology Recent acute onset left bundle branch block on EKG without specific chest pain symptoms Plan The patient is attempting to get cardiology visit in [facility] if that is not achieved I told the patient we would assist in getting him in to see [facility] in [place] Continued inhaled medications as prescribed Flu vaccine was given

## 2014-11-20 NOTE — Telephone Encounter (Signed)
lmomtcb x1 

## 2014-11-21 NOTE — Telephone Encounter (Signed)
[phone] calling back Asher Muir

## 2014-11-21 NOTE — Telephone Encounter (Signed)
lmtcb x1 

## 2014-11-21 NOTE — Telephone Encounter (Signed)
Spoke with pt's wife.  Would like to know which Dr Timoteo Ace would recommend for pt with [facility] and would like referral.  Dr. Delford Field, please advise.  Thank you.

## 2014-11-21 NOTE — Telephone Encounter (Signed)
jamie returned call  [phone]

## 2014-11-21 NOTE — Telephone Encounter (Signed)
LMTC x 1  

## 2014-11-24 ENCOUNTER — Telehealth: Payer: Self-pay | Admitting: *Deleted

## 2014-11-24 NOTE — Telephone Encounter (Signed)
I could see him next Friday 23rd. cdm

## 2014-11-24 NOTE — Telephone Encounter (Signed)
Spoke with pt's wife and appt made for pt to see [name] on 12/05/14 at 2:15

## 2014-11-24 NOTE — Telephone Encounter (Signed)
Order entered for Cardiology referral. Attempted to contact patient. Left message to call back.

## 2014-11-24 NOTE — Telephone Encounter (Signed)
Received message from [name] to schedule pt for appt with him on September 14,2016 at 12:30 or 4:45.  I called pt to schedule this appt.  Left message on home and work numbers to call office.  No answer or voicemail on mobile number.

## 2014-11-24 NOTE — Telephone Encounter (Signed)
Spoke with pt's wife. Pt is unable to be here for appt with [name] on 11/26/14.  Pt is also unavailable for previously scheduled appt with Wilburt Finlay on 11/28/14 and that appt has been cancelled.  Will forward to [name] to see when pt can be worked into his schedule.  Wife states after this week pt is available any time.

## 2014-11-24 NOTE — Telephone Encounter (Signed)
pls try to get pt consult with [facility] ASAP for new LBBB and dyspnea ??CAD

## 2014-11-24 NOTE — Telephone Encounter (Signed)
lmomtcb for pt on home # ATC pt's cell # - line rang multiple times with NA and no option to leave msg.   Please let me speak directly to pt when he calls back.

## 2014-11-24 NOTE — Telephone Encounter (Signed)
Follow up  Pt returning Pat's phone call. Pt wife stated pt can not make 9/14 at either time. Please call back at [phone] and discuss.

## 2014-11-25 NOTE — Telephone Encounter (Signed)
Called # for spouse and LMTCB x1 Called spouses's # and NA and no VM WCB

## 2014-11-25 NOTE — Telephone Encounter (Signed)
Pt wife returned call Call husband [phone] Or call wife back at [phone]

## 2014-11-25 NOTE — Telephone Encounter (Signed)
Patient's wife returned call, can be reached at [phone] (wife).

## 2014-11-25 NOTE — Telephone Encounter (Signed)
Called # listed for spouse and line rang numerous times, NA and no VM WCB

## 2014-11-25 NOTE — Telephone Encounter (Signed)
Pt is scheduled for [name] on 12/05/14. Called home and lmtcb x1 Called # listed for work and [employer] x1

## 2014-11-26 NOTE — Telephone Encounter (Signed)
Called and spoke to pt's wife. Informed her of the referral. Pt already aware of referral and has an appt on 11/28/14 @10 :30 am with Wilburt Finlay, PA-C. Nothing further needed at this time.

## 2014-11-26 NOTE — Telephone Encounter (Addendum)
Pt's appt is scheduled with [name] for 9/23; not with Wilburt Finlay, PA-C on 11/28/14.  Spoke with pt's wife.  Confirmed appt.  Wife aware and will inform pt.  Nothing further needed.

## 2014-11-28 ENCOUNTER — Ambulatory Visit: Payer: BLUE CROSS/BLUE SHIELD | Admitting: Physician Assistant

## 2014-12-05 ENCOUNTER — Encounter: Payer: Self-pay | Admitting: Cardiovascular Disease

## 2014-12-05 ENCOUNTER — Ambulatory Visit (INDEPENDENT_AMBULATORY_CARE_PROVIDER_SITE_OTHER): Payer: BLUE CROSS/BLUE SHIELD | Admitting: Cardiovascular Disease

## 2014-12-05 VITALS — BP 130/62 | HR 72 | Ht 69.0 in | Wt 157.0 lb

## 2014-12-05 DIAGNOSIS — I447 Left bundle-branch block, unspecified: Secondary | ICD-10-CM | POA: Diagnosis not present

## 2014-12-05 DIAGNOSIS — F17201 Nicotine dependence, unspecified, in remission: Secondary | ICD-10-CM | POA: Diagnosis not present

## 2014-12-05 DIAGNOSIS — R0609 Other forms of dyspnea: Secondary | ICD-10-CM | POA: Diagnosis not present

## 2014-12-05 LAB — BASIC METABOLIC PANEL
BUN: 15 mg/dL (ref 6–23)
CALCIUM: 9.2 mg/dL (ref 8.4–10.5)
CHLORIDE: 102 meq/L (ref 96–112)
CO2: 30 mEq/L (ref 19–32)
CREATININE: 1.16 mg/dL (ref 0.40–1.50)
GFR: 67.27 mL/min (ref >60.00)
GLUCOSE: 87 mg/dL (ref 70–99)
Potassium: 3.9 mEq/L (ref 3.5–5.1)
Sodium: 139 mEq/L (ref 135–145)

## 2014-12-05 NOTE — Progress Notes (Addendum)
Chief Complaint  Patient presents with  . Shortness of Breath     History of Present Illness: 64 yo male with history of COPD/bronchitis, former tobacco abuse and new LBBB referred today by Dr. Delford Field in Pulmonary for further evaluation of dyspnea and abnormal EKG. He smoked 2 ppd for 40 years and quit in 2008. He has no known CAD. He is followed closely by Dr. Delford Field for COPD. He has noticed dyspnea is more pronounced over the last few months. EKG in pulmonary office showed new LBBB. He walks every day and has had more dyspnea with exertion. No chest pain. He has had some dizziness but no syncope. He continues to work in the [employer] He did see Dr. Daleen Squibb in 2007 and had a normal EKG at that time.  Primary Care Physician: Shirlean Mylar  Past Medical History  Diagnosis Date  . COPD (chronic obstructive pulmonary disease)   . SOB (shortness of breath)   . History of weight change   . Sneezing   . Head ache   . Dizziness   . Sexual dysfunction   . Asthmatic bronchitis   . 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 Appendectomy      Current Outpatient Prescriptions  Medication Sig Dispense Refill  . albuterol (PROVENTIL HFA;VENTOLIN HFA) 108 (90 BASE) MCG/ACT inhaler Inhale 2 puffs into the lungs every 6 (six) hours as needed for wheezing. 1 Inhaler 6  . aspirin 81 MG tablet Take 81 mg by mouth every other day.     . budesonide-formoterol (SYMBICORT) 160-4.5 MCG/ACT inhaler Inhale 2 puffs into the lungs 2 (two) times daily. 1 Inhaler 0  . Calcium Carbonate-Vit D-Min (CALCIUM 1200 PO) Take 1 tablet by mouth daily.      . fluticasone (FLONASE) 50 MCG/ACT nasal spray Place 2 sprays into both nostrils daily as needed for allergies or rhinitis.    . Multiple Vitamin (MULTIVITAMIN) tablet Take 1 tablet by mouth daily.      . tamsulosin (FLOMAX) 0.4 MG CAPS capsule Take 0.4 mg by mouth daily.    Marland Kitchen zinc  gluconate 50 MG tablet Take 50 mg by mouth daily.     No current facility-administered medications for this visit.    No Known Allergies  Social History   Social History  . Marital Status: Married    Spouse Name: N/A  . Number of Children: 1  . Years of Education: N/A   Occupational History  . Garment/textile technologist    Social History Main Topics  . Smoking status: Former Smoker -- 2.00 packs/day for 39 years    Types: Cigarettes    Quit date: 03/14/2006  . Smokeless tobacco: Never Used  . Alcohol Use: No  . Drug Use: No  . Sexual Activity: Not on file   Other Topics Concern  . Not on file   Social History Narrative    Family History  Problem Relation Age of Onset  . COPD    . Heart disease Mother     rheumatic fever  . Emphysema Father   . Rheumatic fever Mother   . Heart attack Maternal Grandfather     Review of Systems:  As stated in the HPI and otherwise negative.   BP 130/62 mmHg  Pulse 72  Ht 5\' 9"  (1.753 m)  Wt 157 lb (71.215 kg)  BMI 23.17 kg/m2  SpO2 95%  Physical Examination: General:  Well developed, well nourished, NAD HEENT: OP clear, mucus membranes moist SKIN: warm, dry. No rashes. Neuro: No focal deficits Musculoskeletal: Muscle strength 5/5 all ext Psychiatric: Mood and affect normal Neck: No JVD, no carotid bruits, no thyromegaly, no lymphadenopathy. Lungs:Clear bilaterally, no wheezes, rhonci, crackles Cardiovascular: Regular rate and rhythm. No murmurs, gallops or rubs. Abdomen:Soft. Bowel sounds present. Non-tender.  Extremities: No lower extremity edema. Pulses are 2 + in the bilateral DP/PT.  EKG:  EKG is ordered today. The ekg ordered today demonstrates NSR, LBBB  Recent Labs: No results found for requested labs within last 365 days.   Lipid Panel No results found for: CHOL, TRIG, HDL, CHOLHDL, VLDL, LDLCALC, LDLDIRECT   Wt Readings from Last 3 Encounters:  12/05/14 157 lb (71.215 kg)  11/19/14 157 lb (71.215 kg)    07/30/14 161 lb (73.029 kg)     Other studies Reviewed: Additional studies/ records that were reviewed today include: . Review of the above records demonstrates:    Assessment and Plan:   1. Dyspnea: He has long standing COPD but has had recent change in dyspnea. He is now having exertional dyspnea that is felt to be out of proportion to his level of pulmonary disease. Risk factors for CAD include long time tobacco abuse, FH of CAD and personal history of borderline hyperlipidemia. We have discussed testing to exclude obstructive CAD. I do not think an exercise stress test is appropriate given his baseline LBBB. Will arrange coronary CTA to assess coronaries. Will arrange echocardiogram to assess LV function and exclude structural heart disease.     Addendum 12/16/14: Coronary CTA with diffuse calcific CAD with at least moderate disease in the LAD and RCA. Echo overall normal. Discussed cath with pt and wife over the phone today. Will arrange at 10:30 am on [facility]/14 at [facility]. Risks and benefits reviewed with pt. Orders placed for cath. Cath lab called. Will need to call pt with instructions this week for cath and arrange pre-cath labs next week.   2. LBBB: Will follow  3. Tobacco abuse, in remission: He stopped smoking in 2008  Current medicines are reviewed at length with the patient today.  The patient does not have concerns regarding medicines.  The following changes have been made:  no change  Labs/ tests ordered today include:   Orders Placed This Encounter  Procedures  . CT Heart Morp W/Cta Cor W/Score W/Ca W/Cm &/Or Wo/Cm  . Basic Metabolic Panel (BMET)  . EKG 12-Lead  . Echocardiogram    Disposition:   FU with me in 4 weeks  Signed, Verne Carrow, MD 12/05/2014 3:45 PM    [facility] [address] [place], [place], [place]  [address] Phone: [phone]; Fax: [phone]

## 2014-12-05 NOTE — Patient Instructions (Signed)
Medication Instructions:  Your physician recommends that you continue on your current medications as directed. Please refer to the Current Medication list given to you today.   Labwork: Lab work to be done today--BMP   Testing/Procedures: Your physician has requested that you have an echocardiogram. Echocardiography is a painless test that uses sound waves to create images of your heart. It provides your doctor with information about the size and shape of your heart and how well your heart's chambers and valves are working. This procedure takes approximately one hour. There are no restrictions for this procedure.  Your physician has requested that you have cardiac CT. Cardiac computed tomography (CT) is a painless test that uses an x-ray machine to take clear, detailed pictures of your heart. For further information please visit [email or web address] Please follow instruction sheet as given.     Follow-Up:   Your physician recommends that you schedule a follow-up appointment the week of October 17,2016 with [name]    Any Other Special Instructions Will Be Listed Below (If Applicable).

## 2014-12-11 ENCOUNTER — Encounter: Payer: Self-pay | Admitting: Cardiology

## 2014-12-12 ENCOUNTER — Ambulatory Visit (HOSPITAL_COMMUNITY): Payer: BLUE CROSS/BLUE SHIELD | Attending: Cardiovascular Disease

## 2014-12-12 ENCOUNTER — Other Ambulatory Visit: Payer: Self-pay

## 2014-12-12 DIAGNOSIS — Z87891 Personal history of nicotine dependence: Secondary | ICD-10-CM | POA: Insufficient documentation

## 2014-12-12 DIAGNOSIS — I517 Cardiomegaly: Secondary | ICD-10-CM | POA: Insufficient documentation

## 2014-12-12 DIAGNOSIS — I447 Left bundle-branch block, unspecified: Secondary | ICD-10-CM | POA: Diagnosis not present

## 2014-12-12 DIAGNOSIS — R0609 Other forms of dyspnea: Secondary | ICD-10-CM | POA: Diagnosis not present

## 2014-12-13 DIAGNOSIS — I2510 Atherosclerotic heart disease of native coronary artery without angina pectoris: Secondary | ICD-10-CM

## 2014-12-13 HISTORY — DX: Atherosclerotic heart disease of native coronary artery without angina pectoris: I25.10

## 2014-12-15 ENCOUNTER — Ambulatory Visit (HOSPITAL_COMMUNITY)
Admission: RE | Admit: 2014-12-15 | Discharge: 2014-12-15 | Disposition: A | Discharge status: Home / Self Care | Payer: BLUE CROSS/BLUE SHIELD | Source: Ambulatory Visit | Attending: Cardiovascular Disease | Admitting: Cardiovascular Disease

## 2014-12-15 ENCOUNTER — Encounter (HOSPITAL_COMMUNITY): Payer: Self-pay

## 2014-12-15 DIAGNOSIS — R0609 Other forms of dyspnea: Secondary | ICD-10-CM | POA: Diagnosis not present

## 2014-12-15 DIAGNOSIS — I2510 Atherosclerotic heart disease of native coronary artery without angina pectoris: Secondary | ICD-10-CM | POA: Insufficient documentation

## 2014-12-15 DIAGNOSIS — J432 Centrilobular emphysema: Secondary | ICD-10-CM | POA: Insufficient documentation

## 2014-12-15 DIAGNOSIS — I447 Left bundle-branch block, unspecified: Secondary | ICD-10-CM | POA: Insufficient documentation

## 2014-12-15 MED ORDER — NITROGLYCERIN 0.4 MG SL SUBL
0.4000 mg | SUBLINGUAL_TABLET | SUBLINGUAL | Status: DC | PRN
  Administered 2014-12-15: 0.8 mg via SUBLINGUAL
  Filled 2014-12-15 (×2): qty 25

## 2014-12-15 MED ORDER — METOPROLOL TARTRATE 1 MG/ML IV SOLN
2.5000 mg | Freq: Once | INTRAVENOUS | Status: AC
  Administered 2014-12-15: 2.5 mg via INTRAVENOUS
  Filled 2014-12-15: qty 5

## 2014-12-15 MED ORDER — METOPROLOL TARTRATE 1 MG/ML IV SOLN
INTRAVENOUS | Status: AC
  Administered 2014-12-15: 2.5 mg
  Filled 2014-12-15: qty 5

## 2014-12-15 MED ORDER — IOHEXOL 350 MG/ML SOLN
80.0000 mL | Freq: Once | INTRAVENOUS | Status: AC | PRN
  Administered 2014-12-15: 80 mL via INTRAVENOUS

## 2014-12-15 MED ORDER — METOPROLOL TARTRATE 1 MG/ML IV SOLN
INTRAVENOUS | Status: AC
  Filled 2014-12-15: qty 5

## 2014-12-15 MED ORDER — NITROGLYCERIN 0.4 MG SL SUBL
SUBLINGUAL_TABLET | SUBLINGUAL | Status: AC
  Filled 2014-12-15: qty 2

## 2014-12-16 NOTE — Addendum Note (Signed)
Addended by: Verne Carrow D on: 12/16/2014 12:45 PM   Modules accepted: Orders

## 2014-12-17 ENCOUNTER — Telehealth: Payer: Self-pay | Admitting: *Deleted

## 2014-12-17 ENCOUNTER — Encounter: Payer: Self-pay | Admitting: *Deleted

## 2014-12-17 DIAGNOSIS — R0600 Dyspnea, unspecified: Secondary | ICD-10-CM

## 2014-12-17 NOTE — Telephone Encounter (Signed)
Spoke with pt's wife and verbally went over all cath instructions with her.  I will leave instructions at front desk and pt will pick up when here for lab work on 12/22/14.  Previously scheduled appt with [name] for 10/17 cancelled.  I told pt's wife follow up appt will be scheduled after cath.

## 2014-12-17 NOTE — Telephone Encounter (Signed)
Pt has been scheduled for cath on October 13,2016 at 10:30.  I placed call to pt to go over instructions.  Left message to call back.

## 2014-12-22 ENCOUNTER — Other Ambulatory Visit: Payer: BLUE CROSS/BLUE SHIELD | Admitting: *Deleted

## 2014-12-22 DIAGNOSIS — R0600 Dyspnea, unspecified: Secondary | ICD-10-CM

## 2014-12-22 LAB — CBC WITH DIFFERENTIAL/PLATELET
BASOS ABS: 0.1 10*3/uL (ref 0.0–0.1)
BASOS PCT: 1 % (ref 0–1)
EOS ABS: 0.2 10*3/uL (ref 0.0–0.7)
Eosinophils Relative: 3 % (ref 0–5)
HCT: 44.8 % (ref 39.0–52.0)
Hemoglobin: 15.3 g/dL (ref 13.0–17.0)
LYMPHS PCT: 19 % (ref 12–46)
Lymphs Abs: 1.1 10*3/uL (ref 0.7–4.0)
MCH: 30.4 pg (ref 26.0–34.0)
MCHC: 34.2 g/dL (ref 30.0–36.0)
MCV: 89.1 fL (ref 78.0–100.0)
MONO ABS: 0.5 10*3/uL (ref 0.1–1.0)
MONOS PCT: 8 % (ref 3–12)
MPV: 8.3 fL — ABNORMAL LOW (ref 8.6–12.4)
NEUTROS ABS: 4.1 10*3/uL (ref 1.7–7.7)
Neutrophils Relative %: 69 % (ref 43–77)
PLATELETS: 208 10*3/uL (ref 150–400)
RBC: 5.03 MIL/uL (ref 4.22–5.81)
RDW: 13.7 % (ref 11.5–15.5)
WBC: 5.9 10*3/uL (ref 4.0–10.5)

## 2014-12-22 LAB — BASIC METABOLIC PANEL
BUN: 14 mg/dL (ref 7–25)
CALCIUM: 9.3 mg/dL (ref 8.6–10.3)
CO2: 26 mmol/L (ref 20–31)
Chloride: 102 mmol/L (ref 98–110)
Creat: 1.02 mg/dL (ref 0.70–1.25)
GLUCOSE: 104 mg/dL — AB (ref 65–99)
POTASSIUM: 4 mmol/L (ref 3.5–5.3)
SODIUM: 138 mmol/L (ref 135–146)

## 2014-12-23 LAB — PROTIME-INR
INR: 1 (ref <1.50)
PROTHROMBIN TIME: 13.3 s (ref 11.6–15.2)

## 2014-12-25 ENCOUNTER — Telehealth: Payer: Self-pay | Admitting: Cardiovascular Disease

## 2014-12-25 ENCOUNTER — Ambulatory Visit (HOSPITAL_COMMUNITY)
Admission: RE | Admit: 2014-12-25 | Discharge: 2014-12-25 | Disposition: A | Discharge status: Home / Self Care | Payer: BLUE CROSS/BLUE SHIELD | Source: Ambulatory Visit | Attending: Cardiovascular Disease | Admitting: Cardiovascular Disease

## 2014-12-25 ENCOUNTER — Encounter (HOSPITAL_COMMUNITY)
Admission: RE | Disposition: A | Discharge status: Home / Self Care | Payer: Self-pay | Source: Ambulatory Visit | Attending: Cardiovascular Disease

## 2014-12-25 DIAGNOSIS — Z87891 Personal history of nicotine dependence: Secondary | ICD-10-CM | POA: Insufficient documentation

## 2014-12-25 DIAGNOSIS — J449 Chronic obstructive pulmonary disease, unspecified: Secondary | ICD-10-CM | POA: Diagnosis not present

## 2014-12-25 DIAGNOSIS — I447 Left bundle-branch block, unspecified: Secondary | ICD-10-CM | POA: Diagnosis not present

## 2014-12-25 DIAGNOSIS — Z7951 Long term (current) use of inhaled steroids: Secondary | ICD-10-CM | POA: Insufficient documentation

## 2014-12-25 DIAGNOSIS — Z7982 Long term (current) use of aspirin: Secondary | ICD-10-CM | POA: Diagnosis not present

## 2014-12-25 DIAGNOSIS — I2510 Atherosclerotic heart disease of native coronary artery without angina pectoris: Secondary | ICD-10-CM | POA: Diagnosis present

## 2014-12-25 DIAGNOSIS — I25110 Atherosclerotic heart disease of native coronary artery with unstable angina pectoris: Secondary | ICD-10-CM | POA: Diagnosis not present

## 2014-12-25 DIAGNOSIS — J45909 Unspecified asthma, uncomplicated: Secondary | ICD-10-CM | POA: Diagnosis not present

## 2014-12-25 DIAGNOSIS — R0609 Other forms of dyspnea: Secondary | ICD-10-CM

## 2014-12-25 DIAGNOSIS — Z8249 Family history of ischemic heart disease and other diseases of the circulatory system: Secondary | ICD-10-CM | POA: Insufficient documentation

## 2014-12-25 HISTORY — PX: CARDIAC CATHETERIZATION: SHX172

## 2014-12-25 SURGERY — LEFT HEART CATH AND CORONARY ANGIOGRAPHY

## 2014-12-25 MED ORDER — ASPIRIN 81 MG PO CHEW: 81.0000 mg | CHEWABLE_TABLET | ORAL | Status: DC

## 2014-12-25 MED ORDER — SODIUM CHLORIDE 0.9 % IV SOLN
INTRAVENOUS | Status: AC
  Administered 2014-12-25: 10:00:00 via INTRAVENOUS

## 2014-12-25 MED ORDER — SODIUM CHLORIDE 0.9 % IJ SOLN: 3.0000 mL | INTRAMUSCULAR | Status: DC | PRN

## 2014-12-25 MED ORDER — MIDAZOLAM HCL 2 MG/2ML IJ SOLN
INTRAMUSCULAR | Status: AC
  Filled 2014-12-25: qty 4

## 2014-12-25 MED ORDER — SODIUM CHLORIDE 0.9 % IJ SOLN: 3.0000 mL | Freq: Two times a day (BID) | INTRAMUSCULAR | Status: DC

## 2014-12-25 MED ORDER — MIDAZOLAM HCL 2 MG/2ML IJ SOLN
INTRAMUSCULAR | Status: DC | PRN
  Administered 2014-12-25: 2 mg via INTRAVENOUS
  Administered 2014-12-25: 1 mg via INTRAVENOUS

## 2014-12-25 MED ORDER — VERAPAMIL HCL 2.5 MG/ML IV SOLN
INTRAVENOUS | Status: AC
  Filled 2014-12-25: qty 2

## 2014-12-25 MED ORDER — SODIUM CHLORIDE 0.9 % IV SOLN: INTRAVENOUS | Status: AC

## 2014-12-25 MED ORDER — LIDOCAINE HCL (PF) 1 % IJ SOLN
INTRAMUSCULAR | Status: DC | PRN
  Administered 2014-12-25: 5 mL via INTRADERMAL

## 2014-12-25 MED ORDER — SODIUM CHLORIDE 0.9 % IV SOLN: 250.0000 mL | INTRAVENOUS | Status: DC | PRN

## 2014-12-25 MED ORDER — HEPARIN SODIUM (PORCINE) 1000 UNIT/ML IJ SOLN
INTRAMUSCULAR | Status: DC | PRN
Start: 2014-12-25 — End: 2014-12-25
  Administered 2014-12-25: 3500 [IU] via INTRAVENOUS

## 2014-12-25 MED ORDER — IOHEXOL 350 MG/ML SOLN
INTRAVENOUS | Status: DC | PRN
  Administered 2014-12-25: 85 mL via INTRA_ARTERIAL

## 2014-12-25 MED ORDER — HEPARIN (PORCINE) IN NACL 2-0.9 UNIT/ML-% IJ SOLN
INTRAMUSCULAR | Status: DC | PRN
  Administered 2014-12-25: 11:00:00

## 2014-12-25 MED ORDER — HEPARIN (PORCINE) IN NACL 2-0.9 UNIT/ML-% IJ SOLN
INTRAMUSCULAR | Status: AC
  Filled 2014-12-25: qty 1000

## 2014-12-25 MED ORDER — LIDOCAINE HCL (PF) 1 % IJ SOLN
INTRAMUSCULAR | Status: AC
  Filled 2014-12-25: qty 30

## 2014-12-25 MED ORDER — HEPARIN (PORCINE) IN NACL 2-0.9 UNIT/ML-% IJ SOLN
INTRAMUSCULAR | Status: DC | PRN
  Administered 2014-12-25: 10:00:00 via INTRA_ARTERIAL

## 2014-12-25 MED ORDER — FENTANYL CITRATE (PF) 100 MCG/2ML IJ SOLN
INTRAMUSCULAR | Status: AC
  Filled 2014-12-25: qty 4

## 2014-12-25 MED ORDER — FENTANYL CITRATE (PF) 100 MCG/2ML IJ SOLN
INTRAMUSCULAR | Status: DC | PRN
  Administered 2014-12-25: 25 ug via INTRAVENOUS
  Administered 2014-12-25: 50 ug via INTRAVENOUS

## 2014-12-25 MED ORDER — HEPARIN SODIUM (PORCINE) 1000 UNIT/ML IJ SOLN
INTRAMUSCULAR | Status: AC
  Filled 2014-12-25: qty 1

## 2014-12-25 SURGICAL SUPPLY — 11 items

## 2014-12-25 NOTE — Telephone Encounter (Signed)
New Message    Pharmacy calling to verify quantity of refill that was sent to them for pt. Please call back and advise.

## 2014-12-25 NOTE — Discharge Instructions (Signed)
Radial Site Care °Refer to this sheet in the next few weeks. These instructions provide you with information about caring for yourself after your procedure. Your health care provider may also give you more specific instructions. Your treatment has been planned according to current medical practices, but problems sometimes occur. Call your health care provider if you have any problems or questions after your procedure. °WHAT TO EXPECT AFTER THE PROCEDURE °After your procedure, it is typical to have the following: °· Bruising at the radial site that usually fades within 1-2 weeks. °· Blood collecting in the tissue (hematoma) that may be painful to the touch. It should usually decrease in size and tenderness within 1-2 weeks. °HOME CARE INSTRUCTIONS °· Take medicines only as directed by your health care provider. °· You may shower 24-48 hours after the procedure or as directed by your health care provider. Remove the bandage (dressing) and gently wash the site with plain soap and water. Pat the area dry with a clean towel. Do not rub the site, because this may cause bleeding. °· Do not take baths, swim, or use a hot tub until your health care provider approves. °· Check your insertion site every day for redness, swelling, or drainage. °· Do not apply powder or lotion to the site. °· Do not flex or bend the affected arm for 24 hours or as directed by your health care provider. °· Do not push or pull heavy objects with the affected arm for 24 hours or as directed by your health care provider. °· Do not lift over 10 lb (4.5 kg) for 5 days after your procedure or as directed by your health care provider. °· Ask your health care provider when it is okay to: °¨ Return to work or school. °¨ Resume usual physical activities or sports. °¨ Resume sexual activity. °· Do not drive home if you are discharged the same day as the procedure. Have someone else drive you. °· You may drive 24 hours after the procedure unless otherwise  instructed by your health care provider. °· Do not operate machinery or power tools for 24 hours after the procedure. °· If your procedure was done as an outpatient procedure, which means that you went home the same day as your procedure, a responsible adult should be with you for the first 24 hours after you arrive home. °· Keep all follow-up visits as directed by your health care provider. This is important. °SEEK MEDICAL CARE IF: °· You have a fever. °· You have chills. °· You have increased bleeding from the radial site. Hold pressure on the site and call 911. °SEEK IMMEDIATE MEDICAL CARE IF: °· You have unusual pain at the radial site. °· You have redness, warmth, or swelling at the radial site. °· You have drainage (other than a small amount of blood on the dressing) from the radial site. °· The radial site is bleeding, and the bleeding does not stop after 30 minutes of holding steady pressure on the site. °· Your arm or hand becomes pale, cool, tingly, or numb. °  °This information is not intended to replace advice given to you by your health care provider. Make sure you discuss any questions you have with your health care provider. °  °Document Released: 04/02/2010 Document Revised: 03/21/2014 Document Reviewed: 09/16/2013 °Elsevier Interactive Patient Education ©2016 [employer] Inc[employer] ° °

## 2014-12-25 NOTE — Telephone Encounter (Signed)
Called pharmacy to find out what medication and the pharmacy stated that the pt brought in a Rx for Lipitor 20 mg taking 1 tablet at bedtime and wanted to know the quantity. Because medication is not on pt med lis and saw where pt was at the hosp today. Could you please verify this medication and the quantity. Thank you

## 2014-12-25 NOTE — Telephone Encounter (Signed)
Called pharmacy and gave them the quantity of 30 pills for the Lipitor 20 mg. Ladona Ridgel the pharmacist verbalized understanding.

## 2014-12-25 NOTE — Telephone Encounter (Signed)
30 pills of Lipitor 20 mg. Thanks

## 2014-12-25 NOTE — Interval H&P Note (Signed)
History and Physical Interval Note:  12/25/2014 9:56 AM  Benay Pike  has presented today for cardiac cath with the diagnosis of unstable angina.  The various methods of treatment have been discussed with the patient and family. After consideration of risks, benefits and other options for treatment, the patient has consented to  Procedure(s): Left Heart Cath and Coronary Angiography (N/A) as a surgical intervention .  The patient's history has been reviewed, patient examined, no change in status, stable for surgery.  I have reviewed the patient's chart and labs.  Questions were answered to the patient's satisfaction.    Cath Lab Visit (complete for each Cath Lab visit)  Clinical Evaluation Leading to the Procedure:   ACS: No.  Non-ACS:    Anginal Classification: CCS III  Anti-ischemic medical therapy: No Therapy  Non-Invasive Test Results: No non-invasive testing performed  Prior CABG: No previous CABG        MCALHANY,CHRISTOPHER

## 2014-12-25 NOTE — H&P (View-Only) (Signed)
Chief Complaint  Patient presents with  . Shortness of Breath     History of Present Illness: 64 yo male with history of COPD/bronchitis, former tobacco abuse and new LBBB referred today by Dr. Delford Field in Pulmonary for further evaluation of dyspnea and abnormal EKG. He smoked 2 ppd for 40 years and quit in 2008. He has no known CAD. He is followed closely by Dr. Delford Field for COPD. He has noticed dyspnea is more pronounced over the last few months. EKG in pulmonary office showed new LBBB. He walks every day and has had more dyspnea with exertion. No chest pain. He has had some dizziness but no syncope. He continues to work in the [employer] He did see Dr. Daleen Squibb in 2007 and had a normal EKG at that time.  Primary Care Physician: Shirlean Mylar  Past Medical History  Diagnosis Date  . COPD (chronic obstructive pulmonary disease)   . SOB (shortness of breath)   . History of weight change   . Sneezing   . Head ache   . Dizziness   . Sexual dysfunction   . Asthmatic bronchitis   . 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 Appendectomy      Current Outpatient Prescriptions  Medication Sig Dispense Refill  . albuterol (PROVENTIL HFA;VENTOLIN HFA) 108 (90 BASE) MCG/ACT inhaler Inhale 2 puffs into the lungs every 6 (six) hours as needed for wheezing. 1 Inhaler 6  . aspirin 81 MG tablet Take 81 mg by mouth every other day.     . budesonide-formoterol (SYMBICORT) 160-4.5 MCG/ACT inhaler Inhale 2 puffs into the lungs 2 (two) times daily. 1 Inhaler 0  . Calcium Carbonate-Vit D-Min (CALCIUM 1200 PO) Take 1 tablet by mouth daily.      . fluticasone (FLONASE) 50 MCG/ACT nasal spray Place 2 sprays into both nostrils daily as needed for allergies or rhinitis.    . Multiple Vitamin (MULTIVITAMIN) tablet Take 1 tablet by mouth daily.      . tamsulosin (FLOMAX) 0.4 MG CAPS capsule Take 0.4 mg by mouth daily.    Marland Kitchen zinc  gluconate 50 MG tablet Take 50 mg by mouth daily.     No current facility-administered medications for this visit.    No Known Allergies  Social History   Social History  . Marital Status: Married    Spouse Name: N/A  . Number of Children: 1  . Years of Education: N/A   Occupational History  . Garment/textile technologist    Social History Main Topics  . Smoking status: Former Smoker -- 2.00 packs/day for 39 years    Types: Cigarettes    Quit date: 03/14/2006  . Smokeless tobacco: Never Used  . Alcohol Use: No  . Drug Use: No  . Sexual Activity: Not on file   Other Topics Concern  . Not on file   Social History Narrative    Family History  Problem Relation Age of Onset  . COPD    . Heart disease Mother     rheumatic fever  . Emphysema Father   . Rheumatic fever Mother   . Heart attack Maternal Grandfather     Review of Systems:  As stated in the HPI and otherwise negative.   BP 130/62 mmHg  Pulse 72  Ht 5\' 9"  (1.753 m)  Wt 157 lb (71.215 kg)  BMI 23.17 kg/m2  SpO2 95%  Physical Examination: General:  Well developed, well nourished, NAD HEENT: OP clear, mucus membranes moist SKIN: warm, dry. No rashes. Neuro: No focal deficits Musculoskeletal: Muscle strength 5/5 all ext Psychiatric: Mood and affect normal Neck: No JVD, no carotid bruits, no thyromegaly, no lymphadenopathy. Lungs:Clear bilaterally, no wheezes, rhonci, crackles Cardiovascular: Regular rate and rhythm. No murmurs, gallops or rubs. Abdomen:Soft. Bowel sounds present. Non-tender.  Extremities: No lower extremity edema. Pulses are 2 + in the bilateral DP/PT.  EKG:  EKG is ordered today. The ekg ordered today demonstrates NSR, LBBB  Recent Labs: No results found for requested labs within last 365 days.   Lipid Panel No results found for: CHOL, TRIG, HDL, CHOLHDL, VLDL, LDLCALC, LDLDIRECT   Wt Readings from Last 3 Encounters:  12/05/14 157 lb (71.215 kg)  11/19/14 157 lb (71.215 kg)    07/30/14 161 lb (73.029 kg)     Other studies Reviewed: Additional studies/ records that were reviewed today include: . Review of the above records demonstrates:    Assessment and Plan:   1. Dyspnea: He has long standing COPD but has had recent change in dyspnea. He is now having exertional dyspnea that is felt to be out of proportion to his level of pulmonary disease. Risk factors for CAD include long time tobacco abuse, FH of CAD and personal history of borderline hyperlipidemia. We have discussed testing to exclude obstructive CAD. I do not think an exercise stress test is appropriate given his baseline LBBB. Will arrange coronary CTA to assess coronaries. Will arrange echocardiogram to assess LV function and exclude structural heart disease.     Addendum 12/16/14: Coronary CTA with diffuse calcific CAD with at least moderate disease in the LAD and RCA. Echo overall normal. Discussed cath with pt and wife over the phone today. Will arrange at 10:30 am on [facility]/14 at [facility]. Risks and benefits reviewed with pt. Orders placed for cath. Cath lab called. Will need to call pt with instructions this week for cath and arrange pre-cath labs next week.   2. LBBB: Will follow  3. Tobacco abuse, in remission: He stopped smoking in 2008  Current medicines are reviewed at length with the patient today.  The patient does not have concerns regarding medicines.  The following changes have been made:  no change  Labs/ tests ordered today include:   Orders Placed This Encounter  Procedures  . CT Heart Morp W/Cta Cor W/Score W/Ca W/Cm &/Or Wo/Cm  . Basic Metabolic Panel (BMET)  . EKG 12-Lead  . Echocardiogram    Disposition:   FU with me in 4 weeks  Signed, Verne Carrow, MD 12/05/2014 3:45 PM    [facility] [address] [place], [place], [place]  [address] Phone: [phone]; Fax: [phone]

## 2014-12-26 ENCOUNTER — Encounter (HOSPITAL_COMMUNITY): Payer: Self-pay | Admitting: Cardiovascular Disease

## 2014-12-29 ENCOUNTER — Ambulatory Visit: Payer: BLUE CROSS/BLUE SHIELD | Admitting: Cardiovascular Disease

## 2015-01-14 ENCOUNTER — Ambulatory Visit (INDEPENDENT_AMBULATORY_CARE_PROVIDER_SITE_OTHER): Payer: BLUE CROSS/BLUE SHIELD | Admitting: Cardiovascular Disease

## 2015-01-14 ENCOUNTER — Encounter: Payer: Self-pay | Admitting: Cardiovascular Disease

## 2015-01-14 VITALS — BP 136/72 | HR 68 | Ht 69.0 in | Wt 156.0 lb

## 2015-01-14 DIAGNOSIS — I447 Left bundle-branch block, unspecified: Secondary | ICD-10-CM | POA: Diagnosis not present

## 2015-01-14 DIAGNOSIS — I2510 Atherosclerotic heart disease of native coronary artery without angina pectoris: Secondary | ICD-10-CM

## 2015-01-14 DIAGNOSIS — F17201 Nicotine dependence, unspecified, in remission: Secondary | ICD-10-CM | POA: Diagnosis not present

## 2015-01-14 NOTE — Progress Notes (Signed)
Chief Complaint  Patient presents with  . Shortness of Breath     History of Present Illness: 65 yo male with history of COPD/bronchitis, former tobacco abuse and new LBBB who I met as a new patient 12/05/14. He was referred by Dr. Joya Gaskins in Pulmonary for further evaluation of dyspnea and abnormal EKG. He smoked 2 ppd for 40 years and quit in 2008. He c/o worsened dyspnea despite treatment for COPD. EKG in pulmonary office showed new LBBB. No chest pain. I arranged a coronary CTA which showed diffuse calcific CAD with moderate disease in the LAD and RCA. Echo overall normal. Cardiac cath 12/25/14 with moderate CAD but no focally obstructive lesions.   He is here today for cardiac follow up. He feels well. NO chest pain. No change in dyspnea. Still working. No near syncope, syncope or LE edema.   Primary Care Physician: Carolin Coy  Past Medical History  Diagnosis Date  . COPD (chronic obstructive pulmonary disease) ([place])   . SOB (shortness of breath)   . History of weight change   . Sneezing   . Head ache   . Dizziness   . Sexual dysfunction   . Asthmatic bronchitis   . 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 Appendectomy    . Cardiac catheterization N/A 12/25/2014    Procedure: Left Heart Cath and Coronary Angiography;  Surgeon: Burnell Blanks, MD;  Location: [facility];  Service: Cardiovascular;  Laterality: N/A;    Current Outpatient Prescriptions  Medication Sig Dispense Refill  . albuterol (PROVENTIL HFA;VENTOLIN HFA) 108 (90 BASE) MCG/ACT inhaler Inhale 2 puffs into the lungs every 6 (six) hours as needed for wheezing. 1 Inhaler 6  . aspirin 81 MG tablet Take 81 mg by mouth every other day.     Marland Kitchen atorvastatin (LIPITOR) 20 MG tablet Take 20 mg by mouth daily.    . budesonide-formoterol (SYMBICORT) 160-4.5 MCG/ACT inhaler Inhale 2 puffs into the lungs 2 (two) times daily. 1  Inhaler 0  . fluticasone (FLONASE) 50 MCG/ACT nasal spray Place 2 sprays into both nostrils daily as needed for allergies or rhinitis.    Marland Kitchen ibuprofen (ADVIL,MOTRIN) 200 MG tablet Take 600 mg by mouth every 6 (six) hours as needed (pain).    . Multiple Vitamin (MULTIVITAMIN) tablet Take 1 tablet by mouth daily.      . tamsulosin (FLOMAX) 0.4 MG CAPS capsule Take 0.4 mg by mouth daily.     No current facility-administered medications for this visit.    No Known Allergies  Social History   Social History  . Marital Status: Married    Spouse Name: N/A  . Number of Children: 1  . Years of Education: N/A   Occupational History  . Associate Professor    Social History Main Topics  . Smoking status: Former Smoker -- 2.00 packs/day for 39 years    Types: Cigarettes    Quit date: 03/14/2006  . Smokeless tobacco: Never Used  . Alcohol Use: No  . Drug Use: No  . Sexual Activity: Not on file   Other Topics Concern  . Not on file   Social History Narrative    Family History  Problem Relation Age of Onset  . COPD    . Heart disease Mother     rheumatic fever  . Emphysema Father   . Rheumatic fever Mother   . Heart attack  Maternal Grandfather     Review of Systems:  As stated in the HPI and otherwise negative.   BP 136/72 mmHg  Pulse 68  Ht 5' 9"  (1.753 m)  Wt 156 lb (70.761 kg)  BMI 23.03 kg/m2  SpO2 95%  Physical Examination: General: Well developed, well nourished, NAD HEENT: OP clear, mucus membranes moist SKIN: warm, dry. No rashes. Neuro: No focal deficits Musculoskeletal: Muscle strength 5/5 all ext Psychiatric: Mood and affect normal Neck: No JVD, no carotid bruits, no thyromegaly, no lymphadenopathy. Lungs:Clear bilaterally, no wheezes, rhonci, crackles Cardiovascular: Regular rate and rhythm. No murmurs, gallops or rubs. Abdomen:Soft. Bowel sounds present. Non-tender.  Extremities: No lower extremity edema. Pulses are 2 + in the bilateral  DP/PT.  Cardiac cath 12/25/14:  Prox RCA lesion, 40% stenosed.  Mid RCA to Dist RCA lesion, 40% stenosed.  RPDA lesion, 40% stenosed.  Mid Cx to Dist Cx lesion, 40% stenosed.  Prox LAD to Mid LAD lesion, 40% stenosed.  Mid LAD to Dist LAD lesion, 20% stenosed.  Ost 2nd Diag lesion, 60% stenosed.  Ost 1st Diag to 1st Diag lesion, 60% stenosed.  The left ventricular systolic function is normal.  1. Triple vessel CAD  2. Moderate non-obstructive disease in the RCA, LAD, small caliber diagonal branches and Circumflex artery.  3. Normal LV systolic function.   Echo 12/12/14: Left ventricle: Abnormal septal motion from LBBB Normal GLS -20. The cavity size was normal. Systolic function was normal. The estimated ejection fraction was 55%. Wall motion was normal; there were no regional wall motion abnormalities. - Left atrium: The atrium was mildly dilated. - Atrial septum: No defect or patent foramen ovale was identified.  EKG:  EKG is not ordered today. The ekg ordered today demonstrates  Recent Labs: 12/22/2014: BUN 14; Creat 1.02; Hemoglobin 15.3; Platelets 208; Potassium 4.0; Sodium 138   Lipid Panel No results found for: CHOL, TRIG, HDL, CHOLHDL, VLDL, LDLCALC, LDLDIRECT   Wt Readings from Last 3 Encounters:  01/14/15 156 lb (70.761 kg)  12/25/14 155 lb (70.308 kg)  12/05/14 157 lb (71.215 kg)     Other studies Reviewed: Additional studies/ records that were reviewed today include: . Review of the above records demonstrates:    Assessment and Plan:   1. CAD:  Moderate CAD by cath October 2016. Medical management for now as none of the lesions are flow limiting. Continue ASA, statin. Will check lipids and LFTs in 8 weeks since we just started the Lipitor post cath.   2. LBBB: Will follow  3. Tobacco abuse, in remission: He stopped smoking in 2008.   Current medicines are reviewed at length with the patient today.  The patient does not have concerns  regarding medicines.  The following changes have been made:  no change  Labs/ tests ordered today include:   Orders Placed This Encounter  Procedures  . Lipid Profile  . Hepatic function panel    Disposition:   FU with me in 12 months   Signed, Lauree Chandler, MD 01/14/2015 11:30 AM    [facility] [address], [place], [place]  [address] Phone: [phone]; Fax: [phone]

## 2015-01-14 NOTE — Patient Instructions (Signed)
Medication Instructions:  Your physician recommends that you continue on your current medications as directed. Please refer to the Current Medication list given to you today.   Labwork:  Your physician recommends that you return for  Fasting lab work in: about 8 weeks. Scheduled for December 28,2016.  The lab opens at 7:30 AM    Testing/Procedures: none  Follow-Up: Your physician wants you to follow-up in: 12 months.  You will receive a reminder letter in the mail two months in advance. If you don't receive a letter, please call our office to schedule the follow-up appointment.   Any Other Special Instructions Will Be Listed Below (If Applicable).     If you need a refill on your cardiac medications before your next appointment, please call your pharmacy.

## 2015-02-06 ENCOUNTER — Other Ambulatory Visit: Payer: Self-pay | Admitting: Critical Care Medicine

## 2015-03-05 ENCOUNTER — Telehealth: Payer: Self-pay | Admitting: Cardiovascular Disease

## 2015-03-05 DIAGNOSIS — E785 Hyperlipidemia, unspecified: Secondary | ICD-10-CM

## 2015-03-05 NOTE — Telephone Encounter (Signed)
Spoke with patient's wife (DPR). Patient has stopped taking Lipitor and she wants to know if [name] still wants lipid and liver panel done. Informed patient that there is no past lipid and liver panel results in patient's chart, and that these labs would be good for a baseline. Encouraged patient to keep his lab appointment for now.  Will forward this message to [name] to see if the wants to change patient to another medication and if he still wants lipid and liver panel.

## 2015-03-05 NOTE — Telephone Encounter (Signed)
New Message  Pt wife requested to speak w/ RN- pt has stopped taking lipitor and his previous symptoms (weight loss, loss of appetite, muscle pain) have gone away. Pt wife wanted to know if upcoming lab work is necessary. Please call back and discuss.

## 2015-03-10 NOTE — Telephone Encounter (Signed)
Lipitor was started in the cath lab. When I saw him post cath he was tolerating. I have not seen any notes about poor tolerance. I would like for him to be on a statin but if he can't tolerate the dose that was written, we may need to try a lower dose. I would recommend Lipitor 10 mg three times per week and then check lipids and LFTs in 12 weeks. chris

## 2015-03-11 ENCOUNTER — Other Ambulatory Visit (INDEPENDENT_AMBULATORY_CARE_PROVIDER_SITE_OTHER): Payer: BLUE CROSS/BLUE SHIELD | Admitting: *Deleted

## 2015-03-11 ENCOUNTER — Other Ambulatory Visit: Payer: Self-pay | Admitting: Critical Care Medicine

## 2015-03-11 DIAGNOSIS — I2510 Atherosclerotic heart disease of native coronary artery without angina pectoris: Secondary | ICD-10-CM | POA: Diagnosis not present

## 2015-03-11 LAB — LIPID PANEL
CHOLESTEROL: 188 mg/dL (ref 125–200)
HDL: 34 mg/dL — AB (ref >=40)
LDL CALC: 131 mg/dL — AB (ref <130)
TRIGLYCERIDES: 117 mg/dL (ref <150)
Total CHOL/HDL Ratio: 5.5 Ratio — ABNORMAL HIGH (ref <=5.0)
VLDL: 23 mg/dL (ref <30)

## 2015-03-11 LAB — HEPATIC FUNCTION PANEL
ALK PHOS: 60 U/L (ref 40–115)
ALT: 17 U/L (ref 9–46)
AST: 16 U/L (ref 10–35)
Albumin: 4 g/dL (ref 3.6–5.1)
BILIRUBIN INDIRECT: 0.5 mg/dL (ref 0.2–1.2)
Bilirubin, Direct: 0.1 mg/dL (ref <=0.2)
TOTAL PROTEIN: 6.6 g/dL (ref 6.1–8.1)
Total Bilirubin: 0.6 mg/dL (ref 0.2–1.2)

## 2015-03-11 NOTE — Addendum Note (Signed)
Addended by: Appollonia Klee K on: 03/11/2015 07:35 AM   Modules accepted: Orders  

## 2015-03-12 MED ORDER — ROSUVASTATIN CALCIUM 5 MG PO TABS: ORAL_TABLET | ORAL | Status: DC

## 2015-03-12 NOTE — Telephone Encounter (Signed)
See documentation on lab results.  Pt is going to try Crestor 5 mg three days per week.

## 2015-03-18 ENCOUNTER — Telehealth: Payer: Self-pay | Admitting: Pulmonary Disease

## 2015-03-18 MED ORDER — BUDESONIDE-FORMOTEROL FUMARATE 160-4.5 MCG/ACT IN AERO: 2.0000 | INHALATION_SPRAY | Freq: Two times a day (BID) | RESPIRATORY_TRACT | Status: DC

## 2015-03-18 NOTE — Telephone Encounter (Signed)
Patient given sample of Symbicort. Patient scheduled to see Dr. Jamison Neighbor on 04/21/15. Patient aware. Nothing further needed.

## 2015-03-18 NOTE — Telephone Encounter (Signed)
Can we supply him with some more samples of Symbicort and get him in to see me sooner?  Thanks.

## 2015-03-18 NOTE — Telephone Encounter (Signed)
Patients wife says that Symbicort was sent to pharmacy and insurance will not approve Symbicort.  They said that insurance will not pay for Symbicort until patient has an office visit with Dr. Jamison Neighbor since he is changing from Dr. Delford Field to Dr. Jamison Neighbor. Patient has appointment to see Dr. Jamison Neighbor on 05/20/15.   Patient has enough Breo to last a few days, but he is completely out of Symbicort.  Patients wife says he is doing fine with the [facility], but wants to know what can be done because his medications cannot be filled until his next OV.  Dr. Jamison Neighbor, please advise.

## 2015-03-20 ENCOUNTER — Telehealth: Payer: Self-pay | Admitting: Pulmonary Disease

## 2015-03-20 MED ORDER — BUDESONIDE-FORMOTEROL FUMARATE 160-4.5 MCG/ACT IN AERO
2.0000 | INHALATION_SPRAY | Freq: Two times a day (BID) | RESPIRATORY_TRACT | Status: DC
Start: 2015-03-20 — End: 2015-06-03

## 2015-03-20 NOTE — Telephone Encounter (Signed)
Russell Schaefer came by to pick up samples of Symbicort.   Left only 1 sample for Russell Schaefer Russell Schaefer states that he drove a long way to pick up the samples and this is not enough to hold him over until February. Asked Dr. Sherene Sires if I could give him more samples Dr Sherene Sires said ok to give him 2 more boxes (total of 3 boxes all together) Russell Schaefer given samples. Nothing further needed.

## 2015-04-21 ENCOUNTER — Other Ambulatory Visit: Payer: BLUE CROSS/BLUE SHIELD

## 2015-04-21 ENCOUNTER — Ambulatory Visit (INDEPENDENT_AMBULATORY_CARE_PROVIDER_SITE_OTHER): Payer: BLUE CROSS/BLUE SHIELD | Admitting: Pulmonary Disease

## 2015-04-21 ENCOUNTER — Encounter: Payer: Self-pay | Admitting: Pulmonary Disease

## 2015-04-21 ENCOUNTER — Telehealth: Payer: Self-pay | Admitting: Pulmonary Disease

## 2015-04-21 VITALS — BP 124/62 | HR 78 | Ht 69.0 in | Wt 156.2 lb

## 2015-04-21 DIAGNOSIS — J449 Chronic obstructive pulmonary disease, unspecified: Secondary | ICD-10-CM

## 2015-04-21 DIAGNOSIS — J438 Other emphysema: Secondary | ICD-10-CM | POA: Diagnosis not present

## 2015-04-21 MED ORDER — AEROCHAMBER Z-STAT PLUS CHAMBR MISC: Status: DC

## 2015-04-21 NOTE — Progress Notes (Signed)
Subjective:    Patient ID: Russell Schaefer, male    DOB: 1950-05-14, 65 y.o.   MRN: [id number]  C.C.:  Follow-up for Very Severe COPD w/ Emphysema.  HPI Very Severe COPD w/ Emphysema:  Currently prescribed Symbicort & takes daily as prescribed. He reports her rarely uses his rescue inhaler. No nocturnal awakenings with coughing, wheezing, or dyspnea. No exacerbations since last appointment. He reports baseline dyspnea. He reports he is able to walk a mile at a time but hasn't not been as religious lately. Able to climb all 14 steps in his house without resting at the top but has noticeable dyspnea. He does reports increased humidity & moisture seems to negatively affect his breathing. Previously was on Breo which helped his breathing but caused his cough to worsen. He reports his mouth broke out in ulcers/sore tongue on Spiriva. Previously has also been on Qvar.   Review of Systems No chest pain or tightness. No reflex, dyspepsia, or morning brash water. No sinus congestion, pressure, or drainage.     Objective:   Physical Exam BP 124/62 mmHg  Pulse 78  Ht  (1.753 m)  Wt 156 lb 3.2 oz (70.852 kg)  BMI 23.06 kg/m2  SpO2 92% General:  Awake. Alert. No acute distress. Wife accompanying patient today.  Integument:  Warm & dry. No rash on exposed skin. No bruising. HEENT:  Moist mucus membranes. No oral ulcers. No scleral injection or icterus.  Cardiovascular:  Regular rate. No edema. Normal S1 & S2.  Pulmonary:  Good aeration & clear to auscultation bilateral with symmetrically decreased aeration ly. Symmetric chest wall expansion. No accessory muscle use. Abdomen: Soft. Normal bowel sounds. Protuberant. Grossly nontender. Musculoskeletal:  Normal bulk and tone. No joint deformity or effusion appreciated.  PFT 08/16/13: FVC 2.87 L (63%) FEV1 1.17 L (33%) FEV1/FVC 0.41 FEF 25-75 0.41 L (12%) 08/31/11: FVC 2.96 L (65%) FEV1 1.43 L (40%) FEV1/FVC 0.48 FEF 25-75 0.46 L  (14%)  CARDIAC TTE (12/12/14): LV normal in size with EF 55%. No regional wall motion abnormalities. LA mildly dilated & RA normal in size. RV normal in size and function. No aortic stenosis or regurgitation. Trivial mitral regurgitation without stenosis. No pulmonic stenosis. Mild tricuspid regurgitation. No pericardial effusion.    Assessment & Plan:  65 year old male with long-standing prior history of tobacco use having quit smoking in 2008. In reviewing his prior spirometry from 2015 he does show very severe airways obstruction. Previous documentation reports he also has emphysema. He has been on multiple previous inhalers with intolerance to Spiriva and does feel that Symbicort is helping his breathing. We had a lengthy discussion today regarding the potential for genetic COPD/emphysema from alpha-1 antitrypsin deficiency given his family history of emphysema. We also discussed the opportunity for low dose chest CT imaging for lung cancer screening given his prior history of use and current nonsmoking status. The patient is willing to consider colon cancer screening at this time. I instructed the patient to contact my office for any new breathing problems before his next appointment.  1. Very severe COPD with emphysema: Continuing Symbicort. Given patient a spacer to use with his Symbicort and instructed on proper oral hygiene with dentures. Checking for pulmonary function testing and 6 minute walk test on room air at next appointment. Screening for alpha-1 antitrypsin deficiency. 2. History of tobacco use: Referring patient to our lung cancer screening program. I believe he will qualify for a low-dose CT scan of his chest. 3. Health  maintenance: Patient previously received Influenza Vaccine 12/13/2014, Prevnar 02/24/14, & Pneumonovax 02/28/12. 4. Follow-up: Patient to return to clinic in 2-3 months or sooner if needed.

## 2015-04-21 NOTE — Patient Instructions (Signed)
1. Continue using her Symbicort as prescribed. Remember to use your spacer with your inhaler. 2. We will be doing a breathing and walking test at next appointment. 3. I will see you back in 2-3 months but call me if you have any new problems before then. 4. Our lung cancer screening coordinator will contact you about the CT scan we discussed.  TESTS ORDERED: 1. Full pulmonary function testing at next appointment 2. 6 minute walk test on room air at next appointment 3. Alpha-1 antitrypsin phenotype today

## 2015-04-21 NOTE — Telephone Encounter (Signed)
PFT 08/16/13: FVC 2.87 L (63%) FEV1 1.17 L (33%) FEV1/FVC 0.41 FEF 25-75 0.41 L (12%) 08/31/11: FVC 2.96 L (65%) FEV1 1.43 L (40%) FEV1/FVC 0.48 FEF 25-75 0.46 L (14%)  CARDIAC TTE (12/12/14): LV normal in size with EF 55%. No regional wall motion abnormalities. LA mildly dilated & RA normal in size. RV normal in size and function. No aortic stenosis or regurgitation. Trivial mitral regurgitation without stenosis. No pulmonic stenosis. Mild tricuspid regurgitation. No pericardial effusion.

## 2015-04-29 LAB — ALPHA-1 ANTITRYPSIN PHENOTYPE: A-1 Antitrypsin: 161 mg/dL (ref 83–199)

## 2015-05-20 ENCOUNTER — Ambulatory Visit: Payer: BLUE CROSS/BLUE SHIELD | Admitting: Pulmonary Disease

## 2015-06-01 ENCOUNTER — Telehealth: Payer: Self-pay | Admitting: Pulmonary Disease

## 2015-06-01 NOTE — Telephone Encounter (Signed)
Called spoke with patient's wife about Lung Screening program and to schedule a Shared Decision Making Visit.  Per Asher Muir, pt is still 'unsure' and would like to discuss it further with JN before moving forward.  Advised pt to keep his 4.25.17 appts with JN and he can discuss the program then.  Asher Muir okay with this and voiced her understanding. Will sign and forward to [facility] as FYI.

## 2015-06-03 ENCOUNTER — Telehealth: Payer: Self-pay | Admitting: Pulmonary Disease

## 2015-06-03 MED ORDER — BUDESONIDE-FORMOTEROL FUMARATE 160-4.5 MCG/ACT IN AERO: 2.0000 | INHALATION_SPRAY | Freq: Two times a day (BID) | RESPIRATORY_TRACT | Status: DC

## 2015-06-03 NOTE — Telephone Encounter (Signed)
Called spoke with Russell Schaefer and is aware RX sent in. Nothing further needed

## 2015-06-04 ENCOUNTER — Other Ambulatory Visit: Payer: BLUE CROSS/BLUE SHIELD | Admitting: *Deleted

## 2015-06-04 DIAGNOSIS — E785 Hyperlipidemia, unspecified: Secondary | ICD-10-CM

## 2015-06-04 LAB — HEPATIC FUNCTION PANEL
ALK PHOS: 52 U/L (ref 40–115)
ALT: 21 U/L (ref 9–46)
AST: 16 U/L (ref 10–35)
Albumin: 3.7 g/dL (ref 3.6–5.1)
BILIRUBIN INDIRECT: 0.6 mg/dL (ref 0.2–1.2)
Bilirubin, Direct: 0.1 mg/dL (ref <=0.2)
TOTAL PROTEIN: 6.4 g/dL (ref 6.1–8.1)
Total Bilirubin: 0.7 mg/dL (ref 0.2–1.2)

## 2015-06-04 LAB — LIPID PANEL
Cholesterol: 133 mg/dL (ref 125–200)
HDL: 39 mg/dL — ABNORMAL LOW (ref >=40)
LDL CALC: 77 mg/dL (ref <130)
Total CHOL/HDL Ratio: 3.4 Ratio (ref <=5.0)
Triglycerides: 84 mg/dL (ref <150)
VLDL: 17 mg/dL (ref <30)

## 2015-06-08 DIAGNOSIS — R7302 Impaired glucose tolerance (oral): Secondary | ICD-10-CM | POA: Insufficient documentation

## 2015-06-08 DIAGNOSIS — E782 Mixed hyperlipidemia: Secondary | ICD-10-CM | POA: Insufficient documentation

## 2015-06-22 ENCOUNTER — Ambulatory Visit (INDEPENDENT_AMBULATORY_CARE_PROVIDER_SITE_OTHER): Payer: Medicare HMO | Admitting: Pulmonary Disease

## 2015-06-22 DIAGNOSIS — J449 Chronic obstructive pulmonary disease, unspecified: Secondary | ICD-10-CM

## 2015-06-22 DIAGNOSIS — R0609 Other forms of dyspnea: Secondary | ICD-10-CM

## 2015-06-22 LAB — PULMONARY FUNCTION TEST
DL/VA % PRED: 40 %
DL/VA: 1.83 ml/min/mmHg/L
DLCO COR % PRED: 30 %
DLCO cor: 9.42 ml/min/mmHg
DLCO unc % pred: 29 %
DLCO unc: 9.08 ml/min/mmHg
FEF 25-75 POST: 0.52 L/s
FEF 25-75 PRE: 0.45 L/s
FEF2575-%Change-Post: 15 %
FEF2575-%PRED-POST: 19 %
FEF2575-%PRED-PRE: 17 %
FEV1-%CHANGE-POST: 12 %
FEV1-%PRED-PRE: 39 %
FEV1-%Pred-Post: 44 %
FEV1-Post: 1.48 L
FEV1-Pre: 1.31 L
FEV1FVC-%Change-Post: 10 %
FEV1FVC-%PRED-PRE: 58 %
FEV6-%CHANGE-POST: 2 %
FEV6-%PRED-POST: 68 %
FEV6-%Pred-Pre: 66 %
FEV6-Post: 2.87 L
FEV6-Pre: 2.8 L
FEV6FVC-%CHANGE-POST: 1 %
FEV6FVC-%Pred-Post: 99 %
FEV6FVC-%Pred-Pre: 98 %
FVC-%CHANGE-POST: 2 %
FVC-%PRED-POST: 68 %
FVC-%Pred-Pre: 67 %
FVC-Post: 3.06 L
FVC-Pre: 2.99 L
POST FEV1/FVC RATIO: 48 %
PRE FEV1/FVC RATIO: 44 %
PRE FEV6/FVC RATIO: 94 %
Post FEV6/FVC ratio: 95 %
RV % pred: 163 %
RV: 3.75 L
TLC % pred: 101 %
TLC: 6.92 L

## 2015-06-22 NOTE — Progress Notes (Signed)
PFT done today. 06/22/2015 

## 2015-06-22 NOTE — Progress Notes (Signed)
PFT 06/22/15: FVC 2.99 L (67%) FEV1 1.31 L (39%) FEV1/FVC 0.44 FEF 25-75 0.45 L (17%) no bronchodilator response TLC 6.92 L (101%) RV 163% ERV 114% DLCO corrected 30%   06/22/15:  Walked 413 meters / Baseline Sat 94% on RA / Nadir Sat 86% on RA  LABS 04/21/15 Alpha-1 Antitrypsin:  MM (161)

## 2015-06-24 ENCOUNTER — Encounter: Payer: Self-pay | Admitting: Pulmonary Disease

## 2015-07-07 ENCOUNTER — Ambulatory Visit: Payer: BLUE CROSS/BLUE SHIELD | Admitting: Pulmonary Disease

## 2015-07-08 ENCOUNTER — Ambulatory Visit: Payer: Self-pay | Admitting: Pulmonary Disease

## 2015-07-09 ENCOUNTER — Ambulatory Visit (INDEPENDENT_AMBULATORY_CARE_PROVIDER_SITE_OTHER): Payer: Medicare HMO | Admitting: Pulmonary Disease

## 2015-07-09 ENCOUNTER — Encounter: Payer: Self-pay | Admitting: Pulmonary Disease

## 2015-07-09 VITALS — BP 144/76 | HR 74 | Ht 69.0 in | Wt 159.6 lb

## 2015-07-09 DIAGNOSIS — J438 Other emphysema: Secondary | ICD-10-CM

## 2015-07-09 DIAGNOSIS — J449 Chronic obstructive pulmonary disease, unspecified: Secondary | ICD-10-CM

## 2015-07-09 DIAGNOSIS — B370 Candidal stomatitis: Secondary | ICD-10-CM

## 2015-07-09 DIAGNOSIS — J9611 Chronic respiratory failure with hypoxia: Secondary | ICD-10-CM

## 2015-07-09 MED ORDER — NYSTATIN 100000 UNIT/ML MT SUSP: OROMUCOSAL | Status: DC

## 2015-07-09 MED ORDER — ALBUTEROL SULFATE HFA 108 (90 BASE) MCG/ACT IN AERS: 2.0000 | INHALATION_SPRAY | Freq: Four times a day (QID) | RESPIRATORY_TRACT | Status: DC | PRN

## 2015-07-09 MED ORDER — FLUTTER DEVI: Status: AC

## 2015-07-09 NOTE — Progress Notes (Signed)
Subjective:    Patient ID: Russell Schaefer, male    DOB: 01/08/1951, 65 y.o.   MRN: [id number]  C.C.:  Follow-up for Very Severe COPD w/ Emphysema & Chronic Hypoxic Respiratory Failure.  HPI Very Severe COPD w/ Emphysema:  Previously prescribed Breo & Spiriva but intolerant of both. Reports he has had a cough today that intermittently has produced a yellow or white mucus. He is walking 1/2 mile nearly every evening and reports his breathing improves as he continues with walking. He has been wheezing intermittently. Compliant with Symbicort. Uses his rescue inhaler "several" times a week but didn't need it yesterday.   Chronic Hypoxic Respiratory Failure:  Patient desaturated to 86% during his previous 6 minute walk test. He has never been on oxygen therapy. At the time of that walk the patient was having a mild exacerbation of his COPD. Patient would prefer not to use oxygen if at all possible.  Review of Systems Denies any fever, chills, or sweats. No chest pain or tightness.   No Known Allergies  Current Outpatient Prescriptions on File Prior to Visit  Medication Sig Dispense Refill  . albuterol (PROVENTIL HFA;VENTOLIN HFA) 108 (90 BASE) MCG/ACT inhaler Inhale 2 puffs into the lungs every 6 (six) hours as needed for wheezing. 1 Inhaler 6  . aspirin 81 MG tablet Take 81 mg by mouth every other day.     . budesonide-formoterol (SYMBICORT) 160-4.5 MCG/ACT inhaler Inhale 2 puffs into the lungs 2 (two) times daily. 1 Inhaler 6  . fluticasone (FLONASE) 50 MCG/ACT nasal spray Place 2 sprays into both nostrils daily as needed for allergies or rhinitis.    Marland Kitchen ibuprofen (ADVIL,MOTRIN) 200 MG tablet Take 600 mg by mouth every 6 (six) hours as needed (pain).    . Multiple Vitamin (MULTIVITAMIN) tablet Take 1 tablet by mouth daily.      . rosuvastatin (CRESTOR) 5 MG tablet Take one tablet by mouth three days per week 15 tablet 11  . Spacer/Aero-Holding Chambers (AEROCHAMBER Z-STAT PLUS CHAMBR) MISC Use  as directed 1 each 0   No current facility-administered medications on file prior to visit.    Past Medical History  Diagnosis Date  . COPD (chronic obstructive pulmonary disease) ([facility])   . SOB (shortness of breath)   . History of weight change   . Sneezing   . Head ache   . Dizziness   . Sexual dysfunction   . Emphysema (subcutaneous) (surgical) resulting from a procedure   . Emphysema of lung ([facility])   . Coronary artery disease October 2016    No h/o stent on LHC  . LBBB (left bundle branch block)     Past Surgical History  Procedure Laterality Date  . Appendectomy    . Cardiac catheterization N/A 12/25/2014    Procedure: Left Heart Cath and Coronary Angiography;  Surgeon: Kathleene Hazel, MD;  Location: [facility] INVASIVE [facility];  Service: Cardiovascular;  Laterality: N/A;    Family History  Problem Relation Age of Onset  . Heart disease Mother     rheumatic fever  . Rheumatic fever Mother   . Emphysema Father   . Heart attack Maternal Grandfather   . Lung cancer Paternal Uncle     Social History   Social History  . Marital Status: Married    Spouse Name: N/A  . Number of Children: 1  . Years of Education: N/A   Occupational History  . Garment/textile technologist    Social History Main Topics  .  Smoking status: Former Smoker -- 2.00 packs/day for 40 years    Types: Cigarettes    Quit date: 03/14/2006  . Smokeless tobacco: Never Used  . Alcohol Use: No  . Drug Use: No  . Sexual Activity: Not Asked   Other Topics Concern  . None   Social History Narrative   Originally from [place]. Always lived in [place]. Previously has traveled to [place], [place], [place], [place], [place], [place], [place]. No international travel. 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 No pets currently. No bird exposure. No mold exposure. No asbestos exposure.       Objective:   Physical Exam BP 144/76 mmHg  Pulse 74  Ht 5\' 9"  (1.753 m)   Wt 159 lb 9.6 oz (72.394 kg)  BMI 23.56 kg/m2  SpO2 91% General:  Awake. Alert. No distress. Wife accompanying patient today.  Integument:  Warm & dry. No rash on exposed skin. No bruising. HEENT:  Moist mucus membranes. No oral ulcers. Thrush present on hard and soft palate. Cardiovascular:  Regular rate. No edema. Normal S1 & S2.  Pulmonary:  Clear bilaterally to auscultation. Normal work of breathing on room air. Speaking in complete sentences. Abdomen: Soft. Normal bowel sounds. Nontender. Musculoskeletal:  Normal bulk and tone. No joint deformity or effusion appreciated.  PFT 06/22/15: FVC 2.99 L (67%) FEV1 1.31 L (39%) FEV1/FVC 0.44 FEF 25-75 0.45 L (17%) no bronchodilator response TLC 6.92 L (101%) RV 163% ERV 114% DLCO corrected 30%  08/16/13: FVC 2.87 L (63%) FEV1 1.17 L (33%) FEV1/FVC 0.41 FEF 25-75 0.41 L (12%) 08/31/11: FVC 2.96 L (65%) FEV1 1.43 L (40%) FEV1/FVC 0.48 FEF 25-75 0.46 L (14%)  06/22/15: Walked 413 meters / Baseline Sat 94% on RA / Nadir Sat 86% on RA  CARDIAC TTE (12/12/14): LV normal in size with EF 55%. No regional wall motion abnormalities. LA mildly dilated & RA normal in size. RV normal in size and function. No aortic stenosis or regurgitation. Trivial mitral regurgitation without stenosis. No pulmonic stenosis. Mild tricuspid regurgitation. No pericardial effusion.  LABS 04/21/15 Alpha-1 Antitrypsin: MM (161)    Assessment & Plan:  65 year old male with Very severe COPD with emphysema. Patient does have oral thrush today due to using his Symbicort with dentures in place. Patient does not wish to start oxygen therapy and I feel his 6 minute walk test may have been indicative of his mild exacerbation at the time. Symptomatically he seems to be reasonably controlled but may benefit from further airway clearance therapies. We did discuss pulmonary rehabilitation and he is willing to consider this. We also discussed lung cancer screening at length as well  as the potential treatment modalities for cancer in today's day and age the patient himself reports he would not wish to have her undergo treatment of any sort for lung cancer and therefore we will not be pursuing 1 cancer screening. I instructed the patient and his wife contact my office if he had any new breathing problems or questions before his next appointment.  1. Very severe COPD with emphysema: Continuing Symbicort. Patient counseled on appropriate oral hygiene and to avoid using this inhaler with dentures in place. Prescribing Acapella to use twice daily for airway clearance. Repeat spirometry with bronchodilator challenge next appointment. Referring patient to pulmonary rehabilitation. 2. Chronic hypoxic respiratory failure: Patient would truly prefer not to use oxygen therapy. 6 minute walk test was last  performed during an exacerbation. Plan to repeat 6 minute walk test at next appointment on room air. 3. Oral thrush: Prescribing nystatin swish and swallow 4 times a day until thrush is gone. Patient counseled on appropriate oral hygiene. 4. Health maintenance: Patient previously received Influenza Vaccine 12/13/2014, Prevnar 02/24/14, & Pneumonovax 02/28/12. 5. Follow-up: Patient to return to clinic in 3 months or sooner if needed.  Donna Christen Jamison Neighbor, M.D. [facility] Pager:  [phone] After 3pm or if no response, call [phone] 11:24 AM 07/09/2015

## 2015-07-09 NOTE — Patient Instructions (Addendum)
   Continue using your Symbicort as prescribed. Please remember to take her dentures out before use this inhaler to prevent the thrush you have now.  I'm giving you a prescription for an Acapella/Flutter valve. Use this device twice daily by blowing in it 3 times in a row to clear out your mucus.  We will do a breathing and walking test at your next appointment.  I'm referring you to a Pulmonary Rehabilitation program in [place] to help with your breathing and walking.  Please call my office if you have any questions or concerns before your next appointment.  TESTS ORDERED: 1. 6 minute walk test on room air at next appointment 2. Spirometry with bronchodilator challenge at next appointment

## 2015-07-09 NOTE — Addendum Note (Signed)
Addended by: Boone Master E on: 07/09/2015 12:13 PM   Modules accepted: Orders

## 2015-07-28 ENCOUNTER — Other Ambulatory Visit: Payer: Self-pay

## 2015-07-28 MED ORDER — ROSUVASTATIN CALCIUM 5 MG PO TABS: ORAL_TABLET | ORAL | Status: DC

## 2015-08-04 ENCOUNTER — Other Ambulatory Visit: Payer: Self-pay | Admitting: *Deleted

## 2015-08-04 MED ORDER — ALBUTEROL SULFATE HFA 108 (90 BASE) MCG/ACT IN AERS: 2.0000 | INHALATION_SPRAY | Freq: Four times a day (QID) | RESPIRATORY_TRACT | Status: DC | PRN

## 2015-08-18 ENCOUNTER — Other Ambulatory Visit: Payer: Self-pay | Admitting: *Deleted

## 2015-08-18 MED ORDER — ALBUTEROL SULFATE HFA 108 (90 BASE) MCG/ACT IN AERS: 2.0000 | INHALATION_SPRAY | Freq: Four times a day (QID) | RESPIRATORY_TRACT | Status: DC | PRN

## 2015-10-12 ENCOUNTER — Ambulatory Visit (INDEPENDENT_AMBULATORY_CARE_PROVIDER_SITE_OTHER): Payer: Medicare HMO | Admitting: Pulmonary Disease

## 2015-10-12 VITALS — BP 110/62 | HR 86 | Ht 69.0 in | Wt 154.0 lb

## 2015-10-12 DIAGNOSIS — J9611 Chronic respiratory failure with hypoxia: Secondary | ICD-10-CM

## 2015-10-12 DIAGNOSIS — J449 Chronic obstructive pulmonary disease, unspecified: Secondary | ICD-10-CM

## 2015-10-12 DIAGNOSIS — R0600 Dyspnea, unspecified: Secondary | ICD-10-CM

## 2015-10-12 DIAGNOSIS — J438 Other emphysema: Secondary | ICD-10-CM

## 2015-10-12 LAB — PULMONARY FUNCTION TEST
DL/VA % PRED: 46 %
DL/VA: 2.13 ml/min/mmHg/L
DLCO COR % PRED: 37 %
DLCO COR: 11.53 ml/min/mmHg
DLCO unc % pred: 35 %
DLCO unc: 11.05 ml/min/mmHg
FEF 25-75 POST: 0.61 L/s
FEF 25-75 PRE: 0.53 L/s
FEF2575-%CHANGE-POST: 14 %
FEF2575-%PRED-PRE: 20 %
FEF2575-%Pred-Post: 23 %
FEV1-%Change-Post: 9 %
FEV1-%Pred-Post: 47 %
FEV1-%Pred-Pre: 43 %
FEV1-Post: 1.56 L
FEV1-Pre: 1.43 L
FEV1FVC-%CHANGE-POST: 7 %
FEV1FVC-%PRED-PRE: 60 %
FEV6-%CHANGE-POST: 1 %
FEV6-%PRED-PRE: 70 %
FEV6-%Pred-Post: 71 %
FEV6-Post: 3.01 L
FEV6-Pre: 2.96 L
FEV6FVC-%Change-Post: 0 %
FEV6FVC-%Pred-Post: 99 %
FEV6FVC-%Pred-Pre: 99 %
FVC-%CHANGE-POST: 1 %
FVC-%PRED-POST: 72 %
FVC-%PRED-PRE: 70 %
FVC-PRE: 3.14 L
FVC-Post: 3.19 L
POST FEV6/FVC RATIO: 94 %
PRE FEV1/FVC RATIO: 45 %
Post FEV1/FVC ratio: 49 %
Pre FEV6/FVC Ratio: 95 %

## 2015-10-12 MED ORDER — NYSTATIN 100000 UNIT/ML MT SUSP: OROMUCOSAL | 0 refills | Status: DC

## 2015-10-12 NOTE — Progress Notes (Signed)
Test reviewed.  

## 2015-10-12 NOTE — Progress Notes (Signed)
Subjective:    Patient ID: Russell Schaefer, male    DOB: [birth year], 65 y.o.   MRN: [id number]  C.C.:  Follow-up for Very Severe COPD w/ Emphysema & Chronic Hypoxic Respiratory Failure.  HPI Very Severe COPD w/ Emphysema:  Previously prescribed Breo & Spiriva but intolerant of both. Started on twice daily Acapella at last appointment & continued on Symbicort. He is going to pulmonary rehab 3 times weekly. He does feel his dyspnea is improving. He reports he continues to have intermittent coughing and uses his Acapella at least once daily. Does wheeze intermittently. Uses his rescue inhaler once daily.   Chronic Hypoxic Respiratory Failure:  Patient previously desaturated to 86% but no evidence of significant desaturation on his walk test today. The patient reports he has been requiring oxygen at times during his pulmonary rehab.   Review of Systems No fever, chills, or sweats. No chest pain or pressure. No nausea, emesis or dyspepsia.   No Known Allergies  Current Outpatient Prescriptions on File Prior to Visit  Medication Sig Dispense Refill  . albuterol (PROVENTIL HFA;VENTOLIN HFA) 108 (90 Base) MCG/ACT inhaler Inhale 2 puffs into the lungs every 6 (six) hours as needed for wheezing. 18 g 3  . aspirin 81 MG tablet Take 81 mg by mouth every other day.     . budesonide-formoterol (SYMBICORT) 160-4.5 MCG/ACT inhaler Inhale 2 puffs into the lungs 2 (two) times daily. 1 Inhaler 6  . fluticasone (FLONASE) 50 MCG/ACT nasal spray Place 2 sprays into both nostrils daily as needed for allergies or rhinitis.    Marland Kitchen ibuprofen (ADVIL,MOTRIN) 200 MG tablet Take 600 mg by mouth every 6 (six) hours as needed (pain).    . Multiple Vitamin (MULTIVITAMIN) tablet Take 1 tablet by mouth daily.      Marland Kitchen nystatin (MYCOSTATIN) 100000 UNIT/ML suspension Swish and swallow 41mL four times daily 140 mL 0  . Respiratory Therapy Supplies (FLUTTER) DEVI Use as directed. 1 each 0  . rosuvastatin (CRESTOR) 5 MG tablet Take  one tablet by mouth three days per week 36 tablet 3  . Spacer/Aero-Holding Chambers (AEROCHAMBER Z-STAT PLUS CHAMBR) MISC Use as directed 1 each 0   No current facility-administered medications on file prior to visit.     Past Medical History:  Diagnosis Date  . COPD (chronic obstructive pulmonary disease) (HCC)   . Coronary artery disease October 2016   No h/o stent on LHC  . Dizziness   . Emphysema (subcutaneous) (surgical) resulting from a procedure   . Emphysema of lung (HCC)   . Head ache   . History of weight change   . LBBB (left bundle branch block)   . Sexual dysfunction   . Sneezing   . SOB (shortness of breath)     Past Surgical History:  Procedure Laterality Date  . APPENDECTOMY    . CARDIAC CATHETERIZATION N/A 12/25/2014   Procedure: Left Heart Cath and Coronary Angiography;  Surgeon: Kathleene Hazel, MD;  Location: [facility] INVASIVE CV LAB;  Service: Cardiovascular;  Laterality: N/A;    Family History  Problem Relation Age of Onset  . Heart disease Mother     rheumatic fever  . Rheumatic fever Mother   . Emphysema Father   . Heart attack Maternal Grandfather   . Lung cancer Paternal Uncle     Social History   Social History  . Marital status: Married    Spouse name: N/A  . Number of children: 1  . Years of education: N/A  Occupational History  . Garment/textile technologist    Social History Main Topics  . Smoking status: Former Smoker    Packs/day: 2.00    Years: 40.00    Types: Cigarettes    Quit date: 03/14/2006  . Smokeless tobacco: Never Used  . Alcohol use No  . Drug use: No  . Sexual activity: Not on file   Other Topics Concern  . Not on file   Social History Narrative   Originally from [place]. Always lived in [place]. Previously has traveled to [place], [place], [place], [place], [place], [place], [place]. No international travel. 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 No pets currently. No  bird exposure. No mold exposure. No asbestos exposure.       Objective:   Physical Exam Ht 5\' 9"  (1.753 m)   Wt 154 lb (69.9 kg)   BMI 22.74 kg/m  General:  Awake. Alert. No distress. Wife with patient today.  Integument:  Warm & dry. No rash on exposed skin.  HEENT:  Moist mucus membranes. No oral ulcers. No thrush. No nasal turbinate swelling. Cardiovascular:  Regular rate. No edema. Normal S1 & S2.  Pulmonary: Good aeration bilaterally. No accessory muscle use on room air. Overall clear to auscultation. Abdomen: Soft. Normal bowel sounds. Nondistended. Musculoskeletal:  Normal bulk and tone. No joint deformity or effusion appreciated.  PFT 10/12/15: FVC 3.14 L (70%) FEV1 1.43 L (43%) FEV1/FVC 0.45 FEF 25-75 0.53 L (20%) negative bronchodilator response                                                                    DLCO corrected 37% (Hgb 13.2) 06/22/15: FVC 2.99 L (67%) FEV1 1.31 L (39%) FEV1/FVC 0.44 FEF 25-75 0.45 L (17%) negative bronchodilator response TLC 6.92 L (101%) RV 163% ERV 114% DLCO corrected 30%  08/16/13: FVC 2.87 L (63%) FEV1 1.17 L (33%) FEV1/FVC 0.41 FEF 25-75 0.41 L (12%) 08/31/11: FVC 2.96 L (65%) FEV1 1.43 L (40%) FEV1/FVC 0.48 FEF 25-75 0.46 L (14%)  10/12/15:  Walked 360 meters / Baseline Sat 92% on RA / Nadir Sat 91% on RA 06/22/15: Walked 413 meters / Baseline Sat 94% on RA / Nadir Sat 86% on RA  CARDIAC TTE (12/12/14): LV normal in size with EF 55%. No regional wall motion abnormalities. LA mildly dilated & RA normal in size. RV normal in size and function. No aortic stenosis or regurgitation. Trivial mitral regurgitation without stenosis. No pulmonic stenosis. Mild tricuspid regurgitation. No pericardial effusion.  LABS 04/21/15 Alpha-1 Antitrypsin: MM (161)    Assessment & Plan:  65 year old male with Very severe COPD with emphysema. Patient's spirometry today has significant improved since previous testing likely with the addition of a  cappella for airway clearance. Still exhibits no significant bronchodilator response. His carbon monoxide diffusion capacity remained stable. The previous oxygen requirement with ambulation has resolved although the patient did ambulate for a decreased distance today. He has been utilizing oxygen with strenuous exercise at pulmonary rehabilitation. I cautioned the patient against potential need for oxygen if he chooses a different to join as oxygen will not be made available. Instructed the patient contact my office  if he has any new breathing problems or questions before his next appointment.  1. Very Severe COPD w/ Emphysema: Continuing Symbicort and acappella. No changes at this time. 2. Chronic Hypoxic Respiratory Failure: No desaturation during 6 minute walk test today. Recommend continue maintenance therapy at pulmonary rehabilitation given oxygen requirement with strenuous exercise. 3. Health Maintenance: S/P Influenza Vaccine 12/13/2014, Prevnar 02/24/14, & Pneumonovax 02/28/12. Recommended influenza vaccine September 2017. 4. Follow-up: Patient to return to clinic in 6 months or sooner if needed.  Donna Christen Jamison Neighbor, M.D. [facility] Pager:  [phone] After 3pm or if no response, call [phone] 1:32 PM 10/12/15

## 2015-10-12 NOTE — Patient Instructions (Signed)
   Continue using your Symbicort & albuterol inhaler as prescribed.  Continue using your Acapella/Flutter valve once daily.  I will see you back in 6 months or sooner if needed.

## 2015-11-04 DIAGNOSIS — G4733 Obstructive sleep apnea (adult) (pediatric): Secondary | ICD-10-CM | POA: Insufficient documentation

## 2015-11-04 DIAGNOSIS — G4710 Hypersomnia, unspecified: Secondary | ICD-10-CM | POA: Insufficient documentation

## 2015-11-06 DIAGNOSIS — M7542 Impingement syndrome of left shoulder: Secondary | ICD-10-CM | POA: Insufficient documentation

## 2015-11-06 DIAGNOSIS — M7541 Impingement syndrome of right shoulder: Secondary | ICD-10-CM | POA: Insufficient documentation

## 2015-11-06 DIAGNOSIS — M25811 Other specified joint disorders, right shoulder: Secondary | ICD-10-CM | POA: Insufficient documentation

## 2015-11-06 DIAGNOSIS — M7581 Other shoulder lesions, right shoulder: Secondary | ICD-10-CM | POA: Insufficient documentation

## 2015-11-06 DIAGNOSIS — M25812 Other specified joint disorders, left shoulder: Secondary | ICD-10-CM | POA: Insufficient documentation

## 2015-12-02 DIAGNOSIS — R0902 Hypoxemia: Secondary | ICD-10-CM | POA: Insufficient documentation

## 2016-01-11 ENCOUNTER — Telehealth: Payer: Self-pay | Admitting: Pulmonary Disease

## 2016-01-11 MED ORDER — BUDESONIDE-FORMOTEROL FUMARATE 160-4.5 MCG/ACT IN AERO: 2.0000 | INHALATION_SPRAY | Freq: Two times a day (BID) | RESPIRATORY_TRACT | 6 refills | Status: DC

## 2016-01-11 NOTE — Telephone Encounter (Signed)
Called and spoke with pts wife and she stated that the pt is out of the symbicort.  She stated that she spoke with the pharmacy on Saturday and they promised to her that they did call our office, but there is no documentation of a phone note on this.  Medication has been sent to the pharmacy and nothing further is needed.

## 2016-03-21 ENCOUNTER — Encounter: Payer: Self-pay | Admitting: Physician Assistant

## 2016-03-21 DIAGNOSIS — Z9229 Personal history of other drug therapy: Secondary | ICD-10-CM | POA: Insufficient documentation

## 2016-03-21 NOTE — Progress Notes (Signed)
Cardiology Office Note    Date:  03/22/2016  ID:  Russell Schaefer, DOB 1950-11-02, MRN [id number] PCP:  Dan Maker, MD  Cardiologist:  [name]   Chief Complaint: f/u CAD  History of Present Illness:  Russell Schaefer is a 66 y.o. male with history of moderate nonobstructive CAD, COPD, former tobacco abuse, LBBB, chronic hypoxic respiratory failure who presents for follow-up. He is followed by pulm for his COPD and was previously referred to [name] for evaluation of dyspnea. Coronary CTA showed diffuse calcific CAD with moderate disease in the LAD and RCA. 2D Echo 11/2014: EF 55%, no RWMA, mild LAE. Cath 12/2014 showed 40% prox RCA, 40% mid-distal RCA, 40% RPDA, 40% mid-distal Cx, 40% prox-mid LAD, 20% mid-distal LAD, 60% ostial D2, 60% ostial D1, LVEF normal. LDL in 05/2015 showed LDL 77, LFTs wnl. Hgb was 15.3 and Cr 1.02 in 2016.  The patient returns for yearly followup. He feels he's done well this year. He does notice a gradual decline in exercise tolerance over the last few years but no drastic changes recently. He reports this correlates with being more sedentary. He has not had any CP, syncope, palpitations, edema, PND. He saw urgent care earlier today for persistent clear-sputum cough and nasal congestion and was prescribed doxycycline, prednisone and tessalon. He recently had a sick contact with someone he works with. No fevers, chills or abrupt weight gain.  Past Medical History:  Diagnosis Date  . Chronic respiratory failure (HCC)   . COPD (chronic obstructive pulmonary disease) (HCC)   . Coronary artery disease 12/2014   a. nonobstructive CAD 12/2014.  . Emphysema (subcutaneous) (surgical) resulting from a procedure   . Emphysema of lung (HCC)   . History of home oxygen therapy   . History of weight change   . LBBB (left bundle branch block)   . Sexual dysfunction   . Sneezing     Past Surgical History:  Procedure Laterality Date  . APPENDECTOMY    .  CARDIAC CATHETERIZATION N/A 12/25/2014   Procedure: Left Heart Cath and Coronary Angiography;  Surgeon: Kathleene Hazel, MD;  Location: [facility] INVASIVE CV LAB;  Service: Cardiovascular;  Laterality: N/A;    Current Medications: Current Outpatient Prescriptions  Medication Sig Dispense Refill  . albuterol (PROVENTIL HFA;VENTOLIN HFA) 108 (90 Base) MCG/ACT inhaler Inhale 2 puffs into the lungs every 6 (six) hours as needed for wheezing. 18 g 3  . aspirin 81 MG tablet Take 81 mg by mouth every other day.     . benzonatate (TESSALON) 100 MG capsule Take 100 mg by mouth 3 (three) times daily as needed.    . budesonide-formoterol (SYMBICORT) 160-4.5 MCG/ACT inhaler Inhale 2 puffs into the lungs 2 (two) times daily. 1 Inhaler 6  . doxycycline (VIBRAMYCIN) 100 MG capsule Take 100 mg by mouth 2 (two) times daily.    . fluticasone (FLONASE) 50 MCG/ACT nasal spray Place 2 sprays into both nostrils daily as needed for allergies or rhinitis.    Marland Kitchen ibuprofen (ADVIL,MOTRIN) 200 MG tablet Take 600 mg by mouth every 6 (six) hours as needed (pain).    . Multiple Vitamin (MULTIVITAMIN) tablet Take 1 tablet by mouth daily.      Marland Kitchen nystatin (MYCOSTATIN) 100000 UNIT/ML suspension Swish and swallow 53mL four times daily 140 mL 0  . predniSONE (DELTASONE) 20 MG tablet Take 3 tabs x 4 days, then take 2 tabs x 4 days, then take 1 tab x 4 days; take all doses orally  once daily with breakfast    . Respiratory Therapy Supplies (FLUTTER) DEVI Use as directed. 1 each 0  . rosuvastatin (CRESTOR) 5 MG tablet Take one tablet by mouth three days per week 36 tablet 3  . Spacer/Aero-Holding Chambers (AEROCHAMBER Z-STAT PLUS CHAMBR) MISC Use as directed 1 each 0   No current facility-administered medications for this visit.      Allergies:   Patient has no known allergies.   Social History   Social History  . Marital status: Married    Spouse name: N/A  . Number of children: 1  . Years of education: N/A   Occupational  History  . Garment/textile technologist    Social History Main Topics  . Smoking status: Former Smoker    Packs/day: 2.00    Years: 40.00    Types: Cigarettes    Quit date: 03/14/2006  . Smokeless tobacco: Never Used  . Alcohol use No  . Drug use: No  . Sexual activity: Not Asked   Other Topics Concern  . None   Social History Narrative   Originally from [place]. Always lived in [place]. Previously has traveled to [place], [place], [place], [place], [place], [place], [place]. No international travel. 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 No pets currently. No bird exposure. No mold exposure. No asbestos exposure.      Family History:  The patient's family history includes Emphysema in his father; Heart attack in his maternal grandfather; Heart disease in his mother; Lung cancer in his paternal uncle; Rheumatic fever in his mother.   ROS:   Please see the history of present illness. All other systems are reviewed and otherwise negative.    PHYSICAL EXAM:   VS:  BP 126/68   Pulse 80   Ht 5\' 9"  (1.753 m)   Wt 157 lb (71.2 kg)   BMI 23.18 kg/m   BMI: Body mass index is 23.18 kg/m. GEN: Well nourished, well developed WM in no acute distress  HEENT: normocephalic, atraumatic Neck: no JVD, carotid bruits, or masses Cardiac: RRR; no murmurs, rubs, or gallops, no edema  Respiratory:  clear to auscultation bilaterally, normal work of breathing GI: soft, nontender, nondistended, + BS MS: no deformity or atrophy  Skin: warm and dry, no rash Neuro:  Alert and Oriented x 3, Strength and sensation are intact, follows commands Psych: euthymic mood, full affect  Wt Readings from Last 3 Encounters:  03/22/16 157 lb (71.2 kg)  10/12/15 154 lb (69.9 kg)  07/09/15 159 lb 9.6 oz (72.4 kg)      Studies/Labs Reviewed:   EKG:  EKG was ordered today and personally reviewed by me and demonstrates NSR with LBBB 80bpm, artifact/baseline wander due to  patient's inability to cooperate with staying completely still.  Recent Labs: 06/04/2015: ALT 21   Lipid Panel    Component Value Date/Time   CHOL 133 06/04/2015 0800   TRIG 84 06/04/2015 0800   HDL 39 (L) 06/04/2015 0800   CHOLHDL 3.4 06/04/2015 0800   VLDL 17 06/04/2015 0800   LDLCALC 77 06/04/2015 0800    Additional studies/ records that were reviewed today include: Summarized above.    ASSESSMENT & PLAN:   1. CAD - moderate, nonobstructive. No significant change in symptoms over the last year. Continue surveillance for any angina. Continue risk factor modification with aspirin and statin therapy.  2. Former tobacco abuse - remains abstinent. Follows  with pulmonology for his COPD. We discussed that if he does not see any improvement in treatment of bronchitis to f/u closely with PCP or pulmonology. 3. LBBB - chronic. No sx to suggest bradycardia. 4. Statin therapy - the patient reports prior history of statin intolerance but is tolerating Crestor low dose 3x a week. He has follow-up coming in April of this year with yearly labwork. I will write on his paperwork that he should request that a CMET, CBC and lipid panel result be faxed to our office when they are available for [place] to review. If LDL is >70, may consider addition of Zetia to regimen.  Disposition: F/u with [name] in 1 year.   Medication Adjustments/Labs and Tests Ordered: Current medicines are reviewed at length with the patient today.  Concerns regarding medicines are outlined above. Medication changes, Labs and Tests ordered today are summarized above and listed in the Patient Instructions accessible in Encounters.   Thomasene Mohair PA-C  03/22/2016 1:50 PM    [facility] [address] [place], [place], [place]  [address] Phone: [phone]; Fax: [phone]

## 2016-03-22 ENCOUNTER — Encounter (INDEPENDENT_AMBULATORY_CARE_PROVIDER_SITE_OTHER): Payer: Self-pay

## 2016-03-22 ENCOUNTER — Encounter: Payer: Self-pay | Admitting: Physician Assistant

## 2016-03-22 ENCOUNTER — Ambulatory Visit (INDEPENDENT_AMBULATORY_CARE_PROVIDER_SITE_OTHER): Payer: Medicare HMO | Admitting: Physician Assistant

## 2016-03-22 VITALS — BP 126/68 | HR 80 | Ht 69.0 in | Wt 157.0 lb

## 2016-03-22 DIAGNOSIS — I447 Left bundle-branch block, unspecified: Secondary | ICD-10-CM | POA: Diagnosis not present

## 2016-03-22 DIAGNOSIS — Z87891 Personal history of nicotine dependence: Secondary | ICD-10-CM | POA: Diagnosis not present

## 2016-03-22 DIAGNOSIS — I2510 Atherosclerotic heart disease of native coronary artery without angina pectoris: Secondary | ICD-10-CM | POA: Diagnosis not present

## 2016-03-22 DIAGNOSIS — Z9229 Personal history of other drug therapy: Secondary | ICD-10-CM | POA: Diagnosis not present

## 2016-03-22 NOTE — Patient Instructions (Addendum)
Medication Instructions:  Your physician recommends that you continue on your current medications as directed. Please refer to the Current Medication list given to you today.  Labwork: NONE  Testing/Procedures: NONE  Follow-Up: Your physician wants you to follow-up in: 1 YEAR OV WITH DR Clifton James You will receive a reminder letter in the mail two months in advance. If you don't receive a letter, please call our office to schedule the follow-up appointment.  Any Other Special Instructions Will Be Listed Below (If Applicable). WHEN YOU HAVE LABS DONE PLEASE REQUEST CMET/CBC/AND LIPID PANEL HAVE COPY FAXED TO DR Clifton James AT [phone]  If you need a refill on your cardiac medications before your next appointment, please call your pharmacy.

## 2016-03-24 ENCOUNTER — Ambulatory Visit (INDEPENDENT_AMBULATORY_CARE_PROVIDER_SITE_OTHER)
Admission: RE | Admit: 2016-03-24 | Discharge: 2016-03-24 | Disposition: A | Discharge status: Home / Self Care | Payer: Medicare HMO | Source: Ambulatory Visit | Attending: Pulmonary Disease | Admitting: Pulmonary Disease

## 2016-03-24 ENCOUNTER — Encounter: Payer: Self-pay | Admitting: Pulmonary Disease

## 2016-03-24 ENCOUNTER — Ambulatory Visit (INDEPENDENT_AMBULATORY_CARE_PROVIDER_SITE_OTHER): Payer: Medicare HMO | Admitting: Pulmonary Disease

## 2016-03-24 VITALS — BP 138/60 | HR 99 | Ht 69.0 in | Wt 154.6 lb

## 2016-03-24 DIAGNOSIS — J209 Acute bronchitis, unspecified: Secondary | ICD-10-CM

## 2016-03-24 DIAGNOSIS — J441 Chronic obstructive pulmonary disease with (acute) exacerbation: Secondary | ICD-10-CM | POA: Diagnosis not present

## 2016-03-24 MED ORDER — METHYLPREDNISOLONE ACETATE 80 MG/ML IJ SUSP
80.0000 mg | Freq: Once | INTRAMUSCULAR | Status: AC
  Administered 2016-03-24: 80 mg via INTRAMUSCULAR

## 2016-03-24 MED ORDER — PREDNISONE 10 MG PO TABS: ORAL_TABLET | ORAL | 0 refills | Status: DC

## 2016-03-24 MED ORDER — ALBUTEROL SULFATE (2.5 MG/3ML) 0.083% IN NEBU: 2.5000 mg | INHALATION_SOLUTION | Freq: Four times a day (QID) | RESPIRATORY_TRACT | 3 refills | Status: AC | PRN

## 2016-03-24 NOTE — Progress Notes (Signed)
Subjective:    Patient ID: Russell Schaefer, male    DOB: Mar 10, 1951, 66 y.o.   MRN: [id number]  C.C.:  Acute visit for cough with known Very Severe COPD w/ Emphysema & Chronic Hypoxic Respiratory Failure.  HPI Cough:  He reports he started getting sick on Monday. He developed a cough productive of a "mostly white" mucus with only "very little yellow". Endorses a sick contact through a friend. Denies any sore throat. He has had sinus congestion. Reports his symptoms seemed to be improving yesterday. He was seen by Urgent Care. He has been on Doxycycline and is currently on Prednisone 60mg  daily. He reports initially his Jerilynn Som was helping his cough.   Very Severe COPD w/ Emphysema:  Previously prescribed Breo & Spiriva but intolerant of both. Prescribed Acappella twice daily and Symbicort. Reports he has had increased dyspnea with "hot flashes". He continues to use his Symbicort. He has been using his rescue inhaler 3-4 times daily after his visit to Urgent Care. He is waking up at night coughing.   Chronic Hypoxic Respiratory Failure: Patient using oxygen only with strenuous exercise at pulmonary rehabilitation. Previously no demonstrable oxygen requirement with a 6 minute walk test.  Review of Systems He reports chest pain with his cough. Otherwise no chest pressure. He denies any significant chest tightness. Denies any fever or chills. He is having hot flashes. He denies any nausea, emesis, or diarrhea. He has had abdominal pain his his cough. No reflux or dyspepsia.   No Known Allergies  Current Outpatient Prescriptions on File Prior to Visit  Medication Sig Dispense Refill  . albuterol (PROVENTIL HFA;VENTOLIN HFA) 108 (90 Base) MCG/ACT inhaler Inhale 2 puffs into the lungs every 6 (six) hours as needed for wheezing. 18 g 3  . aspirin 81 MG tablet Take 81 mg by mouth every other day.     . benzonatate (TESSALON) 100 MG capsule Take 100 mg by mouth 3 (three) times daily as needed.     . budesonide-formoterol (SYMBICORT) 160-4.5 MCG/ACT inhaler Inhale 2 puffs into the lungs 2 (two) times daily. 1 Inhaler 6  . doxycycline (VIBRAMYCIN) 100 MG capsule Take 100 mg by mouth 2 (two) times daily.    . fluticasone (FLONASE) 50 MCG/ACT nasal spray Place 2 sprays into both nostrils daily as needed for allergies or rhinitis.    Marland Kitchen ibuprofen (ADVIL,MOTRIN) 200 MG tablet Take 600 mg by mouth every 6 (six) hours as needed (pain).    . Multiple Vitamin (MULTIVITAMIN) tablet Take 1 tablet by mouth daily.      Marland Kitchen nystatin (MYCOSTATIN) 100000 UNIT/ML suspension Swish and swallow 41mL four times daily 140 mL 0  . predniSONE (DELTASONE) 20 MG tablet Take 3 tabs x 4 days, then take 2 tabs x 4 days, then take 1 tab x 4 days; take all doses orally once daily with breakfast    . Respiratory Therapy Supplies (FLUTTER) DEVI Use as directed. 1 each 0  . rosuvastatin (CRESTOR) 5 MG tablet Take one tablet by mouth three days per week 36 tablet 3  . Spacer/Aero-Holding Chambers (AEROCHAMBER Z-STAT PLUS CHAMBR) MISC Use as directed 1 each 0   No current facility-administered medications on file prior to visit.     Past Medical History:  Diagnosis Date  . Chronic respiratory failure (HCC)   . COPD (chronic obstructive pulmonary disease) (HCC)   . Coronary artery disease 12/2014   a. nonobstructive CAD 12/2014.  . Emphysema (subcutaneous) (surgical) resulting from a procedure   .  Emphysema of lung (HCC)   . History of home oxygen therapy   . History of weight change   . LBBB (left bundle branch block)   . Sexual dysfunction   . Sneezing     Past Surgical History:  Procedure Laterality Date  . APPENDECTOMY    . CARDIAC CATHETERIZATION N/A 12/25/2014   Procedure: Left Heart Cath and Coronary Angiography;  Surgeon: Kathleene Hazel, MD;  [facility] INVASIVE CV LAB;  Service: Cardiovascular;  Laterality: N/A;    Family History  Problem Relation Age of Onset  . Heart disease Mother      rheumatic fever  . Rheumatic fever Mother   . Emphysema Father   . Heart attack Maternal Grandfather   . Lung cancer Paternal Uncle     Social History   Social History  . Marital status: Married    Spouse name: N/A  . Number of children: 1  . Years of education: N/A   Occupational History  . Garment/textile technologist    Social History Main Topics  . Smoking status: Former Smoker    Packs/day: 2.00    Years: 40.00    Types: Cigarettes    Quit date: 03/14/2006  . Smokeless tobacco: Never Used  . Alcohol use No  . Drug use: No  . Sexual activity: Not Asked   Other Topics Concern  . None   Social History Narrative   Originally from [place]. Always lived in [place]. Previously has traveled to [place], [place], [place], [place], [place], [place], [place]. No international travel. 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 No pets currently. No bird exposure. No mold exposure. No asbestos exposure.       Objective:   Physical Exam BP 138/60 (BP Location: Left Arm, Cuff Size: Normal)   Pulse 99   Ht 5\' 9"  (1.753 m)   Wt 154 lb 9.6 oz (70.1 kg)   SpO2 93%   BMI 22.83 kg/m  General:  Awake. Wife again with patient. No distress. Appears comfortable.  Integument:  Warm & dry. No bruising. No rash on exposed skin. HEENT:  Moist mucus membranes. Right nasal turbinate swelling. No oral ulcers.  Cardiovascular:  Regular rate. No edema. Normal S1 & S2.  Pulmonary: Symmetrically decreased aeration. Mildly increased work of breathing on room air. Speaking in complete sentences. No wheezing on auscultation. Abdomen: Soft. Normal bowel sounds. Nondistended. Musculoskeletal:  Normal bulk and tone. No joint deformity or effusion appreciated.  PFT 10/12/15: FVC 3.14 L (70%) FEV1 1.43 L (43%) FEV1/FVC 0.45 FEF 25-75 0.53 L (20%) negative bronchodilator response                                                                    DLCO corrected 37% (Hgb  13.2) 06/22/15: FVC 2.99 L (67%) FEV1 1.31 L (39%) FEV1/FVC 0.44 FEF 25-75 0.45 L (17%) negative bronchodilator response TLC 6.92 L (101%) RV 163% ERV 114% DLCO corrected 30%  08/16/13: FVC 2.87 L (63%) FEV1 1.17 L (33%) FEV1/FVC 0.41 FEF 25-75 0.41 L (12%) 08/31/11: FVC 2.96 L (65%) FEV1 1.43 L (40%) FEV1/FVC 0.48 FEF 25-75 0.46 L (14%)  10/12/15:  Walked 360  meters / Baseline Sat 92% on RA / Nadir Sat 91% on RA 06/22/15: Walked 413 meters / Baseline Sat 94% on RA / Nadir Sat 86% on RA  CARDIAC TTE (12/12/14): LV normal in size with EF 55%. No regional wall motion abnormalities. LA mildly dilated & RA normal in size. RV normal in size and function. No aortic stenosis or regurgitation. Trivial mitral regurgitation without stenosis. No pulmonic stenosis. Mild tricuspid regurgitation. No pericardial effusion.  LABS 04/21/15 Alpha-1 Antitrypsin: MM (161)    Assessment & Plan:  65 y.o. male with very severe COPD with emphysema. Patient does appear to have acute bronchitis likely viral in etiology given his symptoms and the fact that his wife is now ill. He has no symptoms that would suggest influenza and he is outside of the window for Tamiflu. He does have continued airway symptoms and a worsening cough which concerns me and I spent a significant amount time today counseling him on the potential need for hospitalization for treatment if his symptoms do not improve with the medication adjustments we are making today. I am switching him over to albuterol via nebulizer to improve drug delivery given the severity of his underlying lung disease. I'm also administering IM steroids in an effort to speed airway recovery. He will continue on his current antibiotic for anti-inflammatory effect.  1. Acute Bronchitis: Checking chest x-ray PA/LAT today. Continuing doxycycline as prescribed. Likely viral in etiology. 2. Very Severe COPD w/ Emphysema with Acute Exacerbation: Administering Depo-Medrol 80 mg IM  1 today. Continuing prednisone taper over a prolonged course. Prescribing a home nebulizer and albuterol to use during his acute illness and then as needed subsequently. Holding off on flutter valve for now. 3. Chronic Hypoxic Respiratory Failure: Continuing on oxygen at pulmonary rehabilitation. 4. Health Maintenance: S/P Influenza Vaccine 12/24/15, Prevnar 02/24/14, & Pneumonovax 02/28/12.  5. Follow-up: Patient to return to clinic in February as scheduled.  Donna Christen Jamison Neighbor, M.D. [facility] Pulmonary & Critical Care Pager:  [phone] After 3pm or if no response, call [phone] 2:06 PM 03/24/16

## 2016-03-24 NOTE — Patient Instructions (Signed)
   Hold off on using your Flutter Valve/Acapella/Green Pickle until you are better.  If you get any worse seek immediate medical attention at a local Emergency Department.  The nebulizer and Albuterol medication I am prescribing today will replace your Albuterol rescue inhaler for the time being. Use this nebulizer medicine 4 times daily while you are sick.  Continue using the Doxycycline that the other doctor prescribed you along with the [facility] for your cough.  Stop using the Prednisone the other doctor gave you. I'm starting you on a different prescription that you will take as follows.  Prednisone 10mg  tablets:  Take 6 tablets daily for 4 days, then  Take 5 tablets daily for 6 days, then   Take 4 tablets daily for 6 days, then  Take 3 tablets daily for 6 days, then  Take 2 tablets daily for 6 days, then   Take 1 tablet daily for 6 days and stop  I will see you back in February as scheduled.  TESTS ORDERED: 1. CXR PA/LAT TODAY

## 2016-04-18 ENCOUNTER — Ambulatory Visit (INDEPENDENT_AMBULATORY_CARE_PROVIDER_SITE_OTHER): Payer: Medicare HMO | Admitting: Pulmonary Disease

## 2016-04-18 ENCOUNTER — Encounter: Payer: Self-pay | Admitting: Pulmonary Disease

## 2016-04-18 VITALS — BP 120/70 | HR 84 | Ht 69.0 in | Wt 154.0 lb

## 2016-04-18 DIAGNOSIS — J449 Chronic obstructive pulmonary disease, unspecified: Secondary | ICD-10-CM

## 2016-04-18 DIAGNOSIS — J439 Emphysema, unspecified: Secondary | ICD-10-CM | POA: Diagnosis not present

## 2016-04-18 DIAGNOSIS — J9611 Chronic respiratory failure with hypoxia: Secondary | ICD-10-CM

## 2016-04-18 MED ORDER — NYSTATIN 100000 UNIT/ML MT SUSP
5.0000 mL | Freq: Four times a day (QID) | OROMUCOSAL | 1 refills | Status: DC
Start: 2016-04-18 — End: 2017-05-02

## 2016-04-18 NOTE — Patient Instructions (Addendum)
   Go back to using your Albuterol rescue inhaler and albuterol in your nebulizer only on an as needed basis if you are having any coughing or wheezing.   You can go ahead and stop using the Prednisone today. Call me if you have any light-headedness, problems with low blood pressure, or feel funny as we stop the medication.  Continue using the oxygen with exercise.  Continue using the Symbicort inhaler as prescribed.  Call me if you have any questions or new breathing problems before your next appointment.

## 2016-04-18 NOTE — Progress Notes (Signed)
Subjective:    Patient ID: Russell Schaefer, male    DOB: January 29, 1951, 66 y.o.   MRN: [id number]  C.C.:  Follow-up for Very Severe COPD w/ Emphysema & Chronic Hypoxic Respiratory Failure.  HPI Very Severe COPD w/ Emphysema:  Intolerant of Spiriva and debris oh. Currently prescribed Symbicort. Seen in January for acute bronchitis and treated with IM Depo-Medrol as well as a prednisone taper. He has tapered to 20mg  daily. He is still coughing some but reports his wheezing has resolved. He is still compliant with Symbicort. He has been using his albuterol nebulizer medication frequently.   Chronic Hypoxic Respiratory Failure: Previously prescribed oxygen only with use at pulmonary rehabilitation.At home he is using his treadmill & bicycle. He reports he is using his oxygen intermittently.   Review of Systems He reports he has had increased dyspepsia and reflux on Prednisone. No fever or chills. No chest pain or tightness.   No Known Allergies  Current Outpatient Prescriptions on File Prior to Visit  Medication Sig Dispense Refill  . albuterol (PROVENTIL HFA;VENTOLIN HFA) 108 (90 Base) MCG/ACT inhaler Inhale 2 puffs into the lungs every 6 (six) hours as needed for wheezing. 18 g 3  . albuterol (PROVENTIL) (2.5 MG/3ML) 0.083% nebulizer solution Take 3 mLs (2.5 mg total) by nebulization every 6 (six) hours as needed for wheezing or shortness of breath. 120 vial 3  . aspirin 81 MG tablet Take 81 mg by mouth every other day.     . budesonide-formoterol (SYMBICORT) 160-4.5 MCG/ACT inhaler Inhale 2 puffs into the lungs 2 (two) times daily. 1 Inhaler 6  . fluticasone (FLONASE) 50 MCG/ACT nasal spray Place 2 sprays into both nostrils daily as needed for allergies or rhinitis.    Marland Kitchen ibuprofen (ADVIL,MOTRIN) 200 MG tablet Take 600 mg by mouth every 6 (six) hours as needed (pain).    . Multiple Vitamin (MULTIVITAMIN) tablet Take 1 tablet by mouth daily.      Marland Kitchen nystatin (MYCOSTATIN) 100000 UNIT/ML suspension  Swish and swallow 5mL four times daily 140 mL 0  . predniSONE (DELTASONE) 10 MG tablet Take 6 tablets daily X4 days, take 5 tablets daily x 6 days, take 4 tablets daily for 6 days, take 3 tablets daily x 6 days, take 2 tablets daily x 6 days, then 1 tablet daily x 6days and then stop 114 tablet 0  . Respiratory Therapy Supplies (FLUTTER) DEVI Use as directed. 1 each 0  . rosuvastatin (CRESTOR) 5 MG tablet Take one tablet by mouth three days per week 36 tablet 3  . Spacer/Aero-Holding Chambers (AEROCHAMBER Z-STAT PLUS CHAMBR) MISC Use as directed 1 each 0  . predniSONE (DELTASONE) 20 MG tablet Take 3 tabs x 4 days, then take 2 tabs x 4 days, then take 1 tab x 4 days; take all doses orally once daily with breakfast     No current facility-administered medications on file prior to visit.     Past Medical History:  Diagnosis Date  . Chronic respiratory failure (HCC)   . COPD (chronic obstructive pulmonary disease) (HCC)   . Coronary artery disease 12/2014   a. nonobstructive CAD 12/2014.  . Emphysema (subcutaneous) (surgical) resulting from a procedure   . Emphysema of lung (HCC)   . History of home oxygen therapy   . History of weight change   . LBBB (left bundle branch block)   . Sexual dysfunction   . Sneezing     Past Surgical History:  Procedure Laterality Date  . APPENDECTOMY    .  CARDIAC CATHETERIZATION N/A 12/25/2014   Procedure: Left Heart Cath and Coronary Angiography;  Surgeon: Kathleene Hazel, MD;  Location: [facility] INVASIVE CV LAB;  Service: Cardiovascular;  Laterality: N/A;    Family History  Problem Relation Age of Onset  . Heart disease Mother     rheumatic fever  . Rheumatic fever Mother   . Emphysema Father   . Heart attack Maternal Grandfather   . Lung cancer Paternal Uncle     Social History   Social History  . Marital status: Married    Spouse name: N/A  . Number of children: 1  . Years of education: N/A   Occupational History  . Chief Strategy Officer    Social History Main Topics  . Smoking status: Former Smoker    Packs/day: 2.00    Years: 40.00    Types: Cigarettes    Quit date: 03/14/2006  . Smokeless tobacco: Never Used  . Alcohol use No  . Drug use: No  . Sexual activity: Not Asked   Other Topics Concern  . None   Social History Narrative   Originally from [place]. Always lived in [place]. Previously has traveled to [place], [place], [place], [place], [place], [place], [place]. No international travel. 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 No pets currently. No bird exposure. No mold exposure. No asbestos exposure.       Objective:   Physical Exam BP 120/70 (BP Location: Right Arm, Patient Position: Sitting, Cuff Size: Normal)   Pulse 84   Ht 5\' 9"  (1.753 m)   Wt 154 lb (69.9 kg) Comment: per pt  SpO2 94%   BMI 22.74 kg/m   General:  Awake. Alert. No acute distress. Accompanied by wife today.  Integument:  Warm & dry. No rash on exposed skin.  Extremities:  No cyanosis or clubbing.  HEENT:  Moist mucus membranes. No oral ulcers. No oral thrush. Minimal nasal turbinate swelling bilaterally. Cardiovascular:  Regular rate and rhythm. No edema. No appreciable JVD.  Pulmonary:  Symmetrically decreased aeration. Otherwise clear on auscultation. No accessory muscle use and speaking in complete sentences on room air. Abdomen: Soft. Normal bowel sounds. Mildly protuberant. Musculoskeletal:  Normal bulk and tone. No joint effusion appreciated.  PFT 10/12/15: FVC 3.14 L (70%) FEV1 1.43 L (43%) FEV1/FVC 0.45 FEF 25-75 0.53 L (20%) negative bronchodilator response                                                                    DLCO corrected 37% (Hgb 13.2) 06/22/15: FVC 2.99 L (67%) FEV1 1.31 L (39%) FEV1/FVC 0.44 FEF 25-75 0.45 L (17%) negative bronchodilator response TLC 6.92 L (101%) RV 163% ERV 114% DLCO corrected 30%  08/16/13: FVC 2.87 L (63%) FEV1 1.17 L (33%)  FEV1/FVC 0.41 FEF 25-75 0.41 L (12%) 08/31/11: FVC 2.96 L (65%) FEV1 1.43 L (40%) FEV1/FVC 0.48 FEF 25-75 0.46 L (14%)  10/12/15:  Walked 360 meters / Baseline Sat 92% on RA / Nadir Sat 91% on RA 06/22/15: Walked 413 meters / Baseline Sat 94% on RA / Nadir Sat 86% on RA  IMAGING CXR PA/LAT 03/24/16 (personally reviewed by me): No parenchymal  mass or opacification appreciated. Mildly increased bilateral interstitial markings especially in the mid and lower lung zones. Flattening of the diaphragms. Heart normal in size & mediastinum normal in contour.  CARDIAC TTE (12/12/14): LV normal in size with EF 55%. No regional wall motion abnormalities. LA mildly dilated & RA normal in size. RV normal in size and function. No aortic stenosis or regurgitation. Trivial mitral regurgitation without stenosis. No pulmonic stenosis. Mild tricuspid regurgitation. No pericardial effusion.  LABS 04/21/15 Alpha-1 Antitrypsin: MM (161)    Assessment & Plan:  66 y.o. male with very severe COPD with emphysema. Patient seems to have recovered well from his acute bronchitis. Reviewing his previous x-ray shows no evidence of pneumonia. He did have significant adverse effects from his high-dose prednisone therapy and I feel it's reasonable to stop his prednisone taper at this time given the low dosage and the adverse effects. I did caution him against potential negative side effects of stopping medication at this time. We did discuss his need to use oxygen with strenuous exercise to prevent cardiac strain. I instructed the patient to contact my office if he had any new breathing problems or questions before next appointment.  1. Very Severe COPD w/ Emphysema:  Stopping prednisone taper at this time given resolution of his previous bronchitis. Continuing Symbicort inhaler as prescribed. Patient to return to using his albuterol inhaler medication as a rescue medication only. 2. Chronic Hypoxic Respiratory Failure: Patient  is encouraged to use oxygen with his home exercise regimen. Checking home pulse oximetry with his own device. 3. Health Maintenance: S/P Influenza Vaccine 12/24/15, Prevnar 02/24/14, & Pneumonovax 02/28/12.  4. Follow-up: Patient to return to clinic in  6 months or sooner if needed.  Donna Christen Jamison Neighbor, M.D. [facility] Pulmonary & Critical Care Pager:  [phone] After 3pm or if no response, call [phone] 11:54 AM 04/18/16

## 2016-06-03 ENCOUNTER — Other Ambulatory Visit: Payer: Self-pay | Admitting: Cardiovascular Disease

## 2016-06-27 ENCOUNTER — Telehealth: Payer: Self-pay | Admitting: *Deleted

## 2016-06-27 MED ORDER — EZETIMIBE 10 MG PO TABS: 10.0000 mg | ORAL_TABLET | Freq: Every day | ORAL | 3 refills | Status: DC

## 2016-06-27 NOTE — Telephone Encounter (Signed)
Lipid results received in office. LDL 93.  [name] would like pt to start Zetia 10 mg by mouth daily.  Pt notified and is willing to start. He is aware to continue Crestor.  Will send Zetia prescription to [facility] mail order.

## 2016-08-02 ENCOUNTER — Telehealth: Payer: Self-pay | Admitting: Pulmonary Disease

## 2016-08-02 MED ORDER — BUDESONIDE-FORMOTEROL FUMARATE 160-4.5 MCG/ACT IN AERO: 2.0000 | INHALATION_SPRAY | Freq: Two times a day (BID) | RESPIRATORY_TRACT | 6 refills | Status: DC

## 2016-08-02 NOTE — Telephone Encounter (Signed)
Called and spoke with pts wife and she is aware of refill of the symbicort that has been sent to the pharmacy.

## 2016-08-15 ENCOUNTER — Telehealth: Payer: Self-pay | Admitting: Cardiovascular Disease

## 2016-08-15 NOTE — Telephone Encounter (Signed)
Mrs.Lieske is calling because Russell Schaefer was put on some new cholesterol medication (Ezetimibe) and he is unable to take it . The medication makes him ache all over .  Please call

## 2016-08-15 NOTE — Telephone Encounter (Signed)
Thanks. Russell Schaefer 

## 2016-08-15 NOTE — Telephone Encounter (Signed)
I spoke with pt's wife who reports pt began to ache all over after starting Zetia.  Pt stopped Zetia about 2 weeks ago and muscle aches went away.  He is taking Crestor 3 days a week and is tolerating without problems.  I asked wife to let us know if he has any more problems.

## 2016-09-26 ENCOUNTER — Telehealth: Payer: Self-pay | Admitting: Pulmonary Disease

## 2016-09-26 MED ORDER — ALBUTEROL SULFATE HFA 108 (90 BASE) MCG/ACT IN AERS: 2.0000 | INHALATION_SPRAY | Freq: Four times a day (QID) | RESPIRATORY_TRACT | 3 refills | Status: AC | PRN

## 2016-09-26 NOTE — Telephone Encounter (Signed)
Spoke with pt's wife, Asher Muir. Pt is needing a refill on Albuterol HFA. Rx has been sent in. Nothing further was needed.

## 2016-11-01 NOTE — Progress Notes (Signed)
Subjective:    Patient ID: Russell Schaefer, male    DOB: March 18, 1950, 66 y.o.   MRN: [id number]  C.C.:  Follow-up for Very Severe COPD w/ Emphysema & Chronic Hypoxic Respiratory Failure.  HPI Very severe COPD with emphysema: Previously intolerant of Spiriva. Prescribed Symbicort. He reports he does cough during a steamy shower. He does wheeze intermittently. He has had increased dyspnea with the increased heat & humidity outside. He is still exercising daily walking on his treadmill and pedaling. He reports extremely infrequent use of his rescue medications. No exacerbations since last appointment.   Chronic hypoxic respiratory failure: Previously prescribed oxygen for use with pulmonary rehabilitation & home exercise. He is still using oxygen with his regimen.   Review of Systems He reports mild dyspepsia today. No other abdominal pain or nausea. No fever or chills. No chest pain or pressure.   No Known Allergies  Current Outpatient Prescriptions on File Prior to Visit  Medication Sig Dispense Refill  . albuterol (PROVENTIL HFA;VENTOLIN HFA) 108 (90 Base) MCG/ACT inhaler Inhale 2 puffs into the lungs every 6 (six) hours as needed for wheezing. 18 g 3  . albuterol (PROVENTIL) (2.5 MG/3ML) 0.083% nebulizer solution Take 3 mLs (2.5 mg total) by nebulization every 6 (six) hours as needed for wheezing or shortness of breath. 120 vial 3  . aspirin 81 MG tablet Take 81 mg by mouth every other day.     . budesonide-formoterol (SYMBICORT) 160-4.5 MCG/ACT inhaler Inhale 2 puffs into the lungs 2 (two) times daily. 1 Inhaler 6  . fluticasone (FLONASE) 50 MCG/ACT nasal spray Place 2 sprays into both nostrils daily as needed for allergies or rhinitis.    Marland Kitchen ibuprofen (ADVIL,MOTRIN) 200 MG tablet Take 600 mg by mouth every 6 (six) hours as needed (pain).    . Multiple Vitamin (MULTIVITAMIN) tablet Take 1 tablet by mouth daily.      Marland Kitchen nystatin (MYCOSTATIN) 100000 UNIT/ML suspension Swish and swallow 13mL  four times daily 140 mL 0  . nystatin (MYCOSTATIN) 100000 UNIT/ML suspension Take 5 mLs (500,000 Units total) by mouth 4 (four) times daily. 60 mL 1  . Respiratory Therapy Supplies (FLUTTER) DEVI Use as directed. 1 each 0  . rosuvastatin (CRESTOR) 5 MG tablet TAKE 1 TABLET ON THREE DAYS A WEEK 36 tablet 3  . Spacer/Aero-Holding Chambers (AEROCHAMBER Z-STAT PLUS CHAMBR) MISC Use as directed 1 each 0   No current facility-administered medications on file prior to visit.     Past Medical History:  Diagnosis Date  . Chronic respiratory failure (HCC)   . COPD (chronic obstructive pulmonary disease) (HCC)   . Coronary artery disease 12/2014   a. nonobstructive CAD 12/2014.  . Emphysema (subcutaneous) (surgical) resulting from a procedure   . Emphysema of lung (HCC)   . History of home oxygen therapy   . History of weight change   . LBBB (left bundle branch block)   . Sexual dysfunction   . Sneezing     Past Surgical History:  Procedure Laterality Date  . APPENDECTOMY    . CARDIAC CATHETERIZATION N/A 12/25/2014   Procedure: Left Heart Cath and Coronary Angiography;  Surgeon: Kathleene Hazel, MD;  Location: [facility] INVASIVE CV LAB;  Service: Cardiovascular;  Laterality: N/A;    Family History  Problem Relation Age of Onset  . Heart disease Mother        rheumatic fever  . Rheumatic fever Mother   . Emphysema Father   . Heart attack Maternal Grandfather   .  Lung cancer Paternal Uncle     Social History   Social History  . Marital status: Married    Spouse name: N/A  . Number of children: 1  . Years of education: N/A   Occupational History  . Garment/textile technologist    Social History Main Topics  . Smoking status: Former Smoker    Packs/day: 2.00    Years: 40.00    Types: Cigarettes    Quit date: 03/14/2006  . Smokeless tobacco: Never Used  . Alcohol use No  . Drug use: No  . Sexual activity: Not Asked   Other Topics Concern  . None   Social History Narrative    Originally from [place]. Always lived in [place]. Previously has traveled to [place], [place], [place], [place], [place], [place], [place]. No international travel. 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 No pets currently. No bird exposure. No mold exposure. No asbestos exposure.       Objective:   Physical Exam BP 122/66 (BP Location: Left Arm, Cuff Size: Normal)   Pulse 71   Ht 5\' 9"  (1.753 m)   Wt 154 lb (69.9 kg)   SpO2 94%   BMI 22.74 kg/m   General:  Accompanied by wife today. No distress. Awake.   Integument:  Warm & dry. No rash on exposed skin. Bruising of various ages noted on forearms. Lymphatics: No appreciated cervical or supraclavicular lymphadenopathy. HEENT:  Minimal nasal turbinate swelling. Moist mucus membranes. No oral ulcers. Cardiovascular:  Regular rate. No edema. Unable to appreciate JVD.  Pulmonary:  Clear to auscultation bilaterally. Normal work of breathing on room air. Abdomen: Soft. Normal bowel sounds. Mildly protuberant. Musculoskeletal:  Normal bulk and tone. No joint effusion appreciated.  PFT 10/12/15: FVC 3.14 L (70%) FEV1 1.43 L (43%) FEV1/FVC 0.45 FEF 25-75 0.53 L (20%) negative bronchodilator response                                                                    DLCO corrected 37% (Hgb 13.2) 06/22/15: FVC 2.99 L (67%) FEV1 1.31 L (39%) FEV1/FVC 0.44 FEF 25-75 0.45 L (17%) negative bronchodilator response TLC 6.92 L (101%) RV 163% ERV 114% DLCO corrected 30%  08/16/13: FVC 2.87 L (63%) FEV1 1.17 L (33%) FEV1/FVC 0.41 FEF 25-75 0.41 L (12%) 08/31/11: FVC 2.96 L (65%) FEV1 1.43 L (40%) FEV1/FVC 0.48 FEF 25-75 0.46 L (14%)  10/12/15:  Walked 360 meters / Baseline Sat 92% on RA / Nadir Sat 91% on RA 06/22/15: Walked 413 meters / Baseline Sat 94% on RA / Nadir Sat 86% on RA  IMAGING CXR PA/LAT 03/24/16 (previously reviewed by me): No parenchymal mass or opacification appreciated. Mildly increased  bilateral interstitial markings especially in the mid and lower lung zones. Flattening of the diaphragms. Heart normal in size & mediastinum normal in contour.  CARDIAC TTE (12/12/14): LV normal in size with EF 55%. No regional wall motion abnormalities. LA mildly dilated & RA normal in size. RV normal in size and function. No aortic stenosis or regurgitation. Trivial mitral regurgitation without stenosis. No pulmonic stenosis. Mild tricuspid regurgitation. No pericardial effusion.  LABS 04/21/15 Alpha-1 Antitrypsin: MM (161)  Assessment & Plan:  66 y.o. male with very severe COPD with emphysema and chronic hypoxic respiratory failure.Symptomatically the patient is well controlled with Symbicort. Previously intolerant of Spiriva. He is continuing to exercise which I continued to encourage. He is continuing to use oxygen with his exercise regimen as well. I instructed the patient to contact me if he had any new breathing problems or questions before his next appointment.  1. Very severe COPD with emphysema:  Continuing Symbicort. No changes. 2. Chronic hypoxic respiratory failure:  Continuing on oxygen therapy with exertion/exercise. 3. Health maintenance: Status post Prevnar 02/24/14 & Pneumonovax 02/28/12. Administering influenza vaccine today. 4. Follow-up: Return to clinic in 6 months or sooner if needed.  Donna Christen Jamison Neighbor, M.D. [facility] Pager:  [phone] After 3pm or if no response, call [phone] 11:35 AM 11/03/16

## 2016-11-03 ENCOUNTER — Encounter: Payer: Self-pay | Admitting: Pulmonary Disease

## 2016-11-03 ENCOUNTER — Ambulatory Visit (INDEPENDENT_AMBULATORY_CARE_PROVIDER_SITE_OTHER): Payer: Medicare HMO | Admitting: Pulmonary Disease

## 2016-11-03 VITALS — BP 122/66 | HR 71 | Ht 69.0 in | Wt 154.0 lb

## 2016-11-03 DIAGNOSIS — J9611 Chronic respiratory failure with hypoxia: Secondary | ICD-10-CM

## 2016-11-03 DIAGNOSIS — J449 Chronic obstructive pulmonary disease, unspecified: Secondary | ICD-10-CM | POA: Diagnosis not present

## 2016-11-03 DIAGNOSIS — Z23 Encounter for immunization: Secondary | ICD-10-CM | POA: Diagnosis not present

## 2016-11-03 NOTE — Patient Instructions (Addendum)
   Continue using your medications & inhalers as prescribed.   Keep [place] with your exercise regimen.  Call me if you have any new breathing problems before your next appointment.

## 2017-03-02 ENCOUNTER — Telehealth: Payer: Self-pay | Admitting: Internal Medicine

## 2017-03-02 MED ORDER — BUDESONIDE-FORMOTEROL FUMARATE 160-4.5 MCG/ACT IN AERO: 2.0000 | INHALATION_SPRAY | Freq: Two times a day (BID) | RESPIRATORY_TRACT | 2 refills | Status: DC

## 2017-03-02 NOTE — Telephone Encounter (Signed)
Spoke with pt's wife, Asher Muir. Pt needs a refill on Symbicort. Rx has been sent in. Nothing further was needed.

## 2017-04-03 ENCOUNTER — Other Ambulatory Visit: Payer: Self-pay | Admitting: Cardiovascular Disease

## 2017-05-02 ENCOUNTER — Ambulatory Visit: Payer: Medicare HMO | Admitting: Internal Medicine

## 2017-05-02 ENCOUNTER — Encounter: Payer: Self-pay | Admitting: Internal Medicine

## 2017-05-02 VITALS — BP 118/70 | HR 74 | Ht 69.0 in | Wt 151.0 lb

## 2017-05-02 DIAGNOSIS — R0609 Other forms of dyspnea: Secondary | ICD-10-CM | POA: Diagnosis not present

## 2017-05-02 DIAGNOSIS — J9611 Chronic respiratory failure with hypoxia: Secondary | ICD-10-CM

## 2017-05-02 DIAGNOSIS — J449 Chronic obstructive pulmonary disease, unspecified: Secondary | ICD-10-CM | POA: Diagnosis not present

## 2017-05-02 MED ORDER — GLYCOPYRROLATE-FORMOTEROL 9-4.8 MCG/ACT IN AERO: 2.0000 | INHALATION_SPRAY | Freq: Two times a day (BID) | RESPIRATORY_TRACT | 11 refills | Status: DC

## 2017-05-02 MED ORDER — GLYCOPYRROLATE-FORMOTEROL 9-4.8 MCG/ACT IN AERO: 2.0000 | INHALATION_SPRAY | Freq: Two times a day (BID) | RESPIRATORY_TRACT | 0 refills | Status: DC

## 2017-05-02 NOTE — Progress Notes (Signed)
Subjective:    Patient ID: Russell Schaefer, male    DOB: 1950/09/14, 67 y.o.   MRN: [id number]  C.C.:  Follow-up for Very Severe COPD w/ Emphysema & Chronic Hypoxic Respiratory Failure.  HPI Very severe COPD with emphysema: Previously intolerant of Spiriva.   Chronic hypoxic respiratory failure: Previously prescribed oxygen for use with pulmonary rehabilitation & home exercise. He is still using oxygen with his regimen.     Brief patient profile:  66 yowm MM quit smoking 2008 followed for COPD GOLD III on noct 02 2.5 lpm  Intol of spiriva powder mouth broke out  05/02/2017  Extended f/u ov/Mayce Noyes re: transition of care from Dr Jamison Neighbor Chief Complaint  Patient presents with  . Follow-up    Former Dr Jamison Neighbor pt. Pt states his breathing is unchanged since last visit. He is using his albuterol 3 x per wk and neb with albuterol 4 x per wk on average.   Dyspnea: gxt most evening 20-25 min 2 - 2.2 mph and level grade  s stopping and s 02  Cough: no am flare/ sporadic Sleep: ok on /off 2.5lpm   No obvious day to day or daytime variability or assoc excess/ purulent sputum or mucus plugs or hemoptysis or cp or chest tightness, subjective wheeze or overt sinus or hb symptoms. No unusual exposure hx or h/o childhood pna/ asthma or knowledge of premature birth.  Sleeping ok flat without nocturnal  or early am exacerbation  of respiratory  c/o's or need for noct saba. Also denies any obvious fluctuation of symptoms with weather or environmental changes or other aggravating or alleviating factors except as outlined above   Current Allergies, Complete Past Medical History, Past Surgical History, Family History, and Social History were reviewed in [employer] record.  ROS  The following are not active complaints unless bolded Hoarseness, sore throat, dysphagia, dental problems, itching, sneezing,  nasal congestion or discharge of excess mucus or purulent secretions, ear ache,    fever, chills, sweats, unintended wt loss or wt gain, classically pleuritic or exertional cp,  orthopnea pnd or leg swelling, presyncope, palpitations, abdominal pain, anorexia, nausea, vomiting, diarrhea  or change in bowel habits or change in bladder habits, change in stools or change in urine, dysuria, hematuria,  rash, arthralgias, visual complaints, headache, numbness, weakness or ataxia or problems with walking or coordination,  change in mood/affect or memory.        Current Meds  Medication Sig  . albuterol (PROVENTIL HFA;VENTOLIN HFA) 108 (90 Base) MCG/ACT inhaler Inhale 2 puffs into the lungs every 6 (six) hours as needed for wheezing.  Marland Kitchen albuterol (PROVENTIL) (2.5 MG/3ML) 0.083% nebulizer solution Take 3 mLs (2.5 mg total) by nebulization every 6 (six) hours as needed for wheezing or shortness of breath.  Marland Kitchen aspirin 81 MG tablet Take 81 mg by mouth every other day.   . fluticasone (FLONASE) 50 MCG/ACT nasal spray Place 2 sprays into both nostrils daily as needed for allergies or rhinitis.  Marland Kitchen ibuprofen (ADVIL,MOTRIN) 200 MG tablet Take 600 mg by mouth every 6 (six) hours as needed (pain).  . Multiple Vitamin (MULTIVITAMIN) tablet Take 1 tablet by mouth daily.    . OXYGEN 2.5 lpm with sleep "as needed" [facility]  . Respiratory Therapy Supplies (FLUTTER) DEVI Use as directed.  . rosuvastatin (CRESTOR) 5 MG tablet Take 1 tablet (5 mg total) by mouth every 3 (three) days.  . [  budesonide-formoterol (SYMBICORT) 160-4.5 MCG/ACT inhaler Inhale 2 puffs into  the lungs 2 (two) times daily.                   Objective:   Physical Exam   Wt Readings from Last 3 Encounters:  05/02/17 151 lb (68.5 kg)  11/03/16 154 lb (69.9 kg)  04/18/16 154 lb (69.9 kg)     Vital signs reviewed - Note on arrival 02 sats  93% on RA      amb hoarse wm nad       HEENT: nl  , turbinates bilaterally, and oropharynx. Nl external ear canals without cough reflex - top denture    NECK :  without  JVD/Nodes/TM/ nl carotid upstrokes bilaterally   LUNGS: no acc muscle use,   barrel contour chest wall with bilateral  Distant late exp wheeze better with plm without cough on insp or exp maneuver and hyper resonant   to  percussion bilaterally     CV:  RRR  no s3 or murmur or increase in P2, and no edema   ABD:  soft and nontender with nl inspiratory excursion in the supine position. No bruits or organomegaly appreciated, bowel sounds nl  MS:  Nl gait/ ext warm without deformities, calf tenderness, cyanosis or clubbing No obvious joint restrictions   SKIN: warm and dry without lesions    NEURO:  alert, approp, nl sensorium with  no motor or cerebellar deficits apparent.     Studies reviewed 05/02/2017   PFT 10-29-15: FVC 3.14 L (70%) FEV1 1.43 L (43%) FEV1/FVC 0.45 FEF 25-75 0.53 L (20%) negative bronchodilator response                                                                    DLCO corrected 37% (Hgb 13.2) 06/22/15: FVC 2.99 L (67%) FEV1 1.31 L (39%) FEV1/FVC 0.44 FEF 25-75 0.45 L (17%) negative bronchodilator response TLC 6.92 L (101%) RV 163% ERV 114% DLCO corrected 30%  08/16/13: FVC 2.87 L (63%) FEV1 1.17 L (33%) FEV1/FVC 0.41 FEF 25-75 0.41 L (12%) 08/31/11: FVC 2.96 L (65%) FEV1 1.43 L (40%) FEV1/FVC 0.48 FEF 25-75 0.46 L (14%)  10/29/15:  Walked 360 meters / Baseline Sat 92% on RA / Nadir Sat 91% on RA 06/22/15: Walked 413 meters / Baseline Sat 94% on RA / Nadir Sat 86% on RA      LABS 04/21/15 Alpha-1 Antitrypsin: MM (161)    Assessment & Plan:

## 2017-05-02 NOTE — Patient Instructions (Addendum)
Plan A = Automatic =  Change symbicort to Bevespi Take 2 puffs first thing in am and then another 2 puffs about 12 hours later.    Work on inhaler technique:  relax and gently blow all the way out then take a nice smooth deep breath back in, triggering the inhaler at same time you start breathing in.  Hold for up to 5 seconds if you can.  . Rinse and gargle with water when done   Plan B = Backup Only use your albuterol (proair red/ proventil yellow) as a rescue medication to be used if you can't catch your breath by resting or doing a relaxed purse lip breathing pattern.  - The less you use it, the better it will work when you need it. - Ok to use the inhaler up to 2 puffs  every 4 hours if you must but call for appointment if use goes up over your usual need - Don't leave home without it !!  (think of it like the spare tire for your car)   Plan C = Crisis - only use your albuterol nebulizer if you first try Plan B and it fails to help > ok to use the nebulizer up to every 4 hours but if start needing it regularly call for immediate appointment   Plan D = Doctor - call me if B and C not adequate  Plan E = ER - go to ER or call 911 if all else fails    Use 02 2.5 lpm at bedtime and on treadmill    Please schedule a follow up visit in 6 months but call sooner if needed with pfts on return

## 2017-05-04 ENCOUNTER — Encounter: Payer: Self-pay | Admitting: Internal Medicine

## 2017-05-04 NOTE — Assessment & Plan Note (Addendum)
05/02/2017  Walked RA x 3 laps @ 185 ft each stopped due to  End of study, nl pace, desats to 86% s sob   05/02/2017 rec 2lpm sleeping and with exertion but no need at rest and simple adls/ room to room walking or car to house walking.  I had an extended discussion with the patient reviewing all relevant studies completed to date and  lasting 25 minutes of a 40  minute transition of care office  visit with complex pt new to me re management of severe copd and 02 dep resp failure   Each maintenance medication was reviewed in detail including most importantly the difference between maintenance and prns and under what circumstances the prns are to be triggered using an action plan format that is not reflected in the computer generated alphabetically organized AVS.    Please see AVS for specific instructions unique to this office visit that I personally wrote and verbalized to the the pt in detail and then reviewed with pt  by my nurse highlighting any changes in therapy/plan of care  recommended at today's visit.

## 2017-05-04 NOTE — Assessment & Plan Note (Signed)
Quit smoking 2008  08/31/2011  Spirometry FeV1 40%  FeV1/FVC 61%  - 10/12/15: FVC 3.14 L (70%) FEV1 1.43 L (43%) FEV1/FVC 0.45 FEF 25-75 0.53 L (20%) negative bronchodilator response   DLCO corrected 37% - 05/02/2017  After extensive coaching inhaler device  effectiveness =    75% (short Ti) > try change symb to bevespi   Pt is Group B in terms of symptom/risk and laba/lama therefore appropriate rx at this point.and could not tol the dpi form of spiriva so reasonable to try bevesop 2bid and change back to symb if any issues  Reviewed: Formulary restrictions will be an ongoing challenge for the forseable future and I would be happy to pick an alternative if the pt will first  provide me a list of them but pt  will need to return here for training for any new device that is required eg dpi vs hfa vs respimat.    In meantime we can always provide samples so the patient never runs out of any needed respiratory medications.

## 2017-05-12 ENCOUNTER — Other Ambulatory Visit: Payer: Self-pay | Admitting: Internal Medicine

## 2017-05-24 ENCOUNTER — Ambulatory Visit: Payer: Medicare HMO | Admitting: Cardiovascular Disease

## 2017-05-24 ENCOUNTER — Encounter: Payer: Self-pay | Admitting: Cardiovascular Disease

## 2017-05-24 VITALS — BP 122/66 | HR 79 | Ht 69.0 in | Wt 149.4 lb

## 2017-05-24 DIAGNOSIS — I2510 Atherosclerotic heart disease of native coronary artery without angina pectoris: Secondary | ICD-10-CM

## 2017-05-24 DIAGNOSIS — Z87891 Personal history of nicotine dependence: Secondary | ICD-10-CM | POA: Diagnosis not present

## 2017-05-24 DIAGNOSIS — I447 Left bundle-branch block, unspecified: Secondary | ICD-10-CM

## 2017-05-24 NOTE — Progress Notes (Signed)
Chief Complaint  Patient presents with  . Follow-up    CAD   History of Present Illness: 68 yo male with history of former tobacco abuse, COPD/bronchitis, LBBB, moderate non-obstructive CAD who is here today for cardiac follow up. I met as a new patient 12/05/14. He was referred by Dr. Joya Gaskins in Pulmonary for further evaluation of dyspnea and abnormal EKG. He smoked 2 ppd for 40 years and quit in 2008. He c/o worsened dyspnea despite treatment for COPD. EKG in pulmonary office showed new LBBB. No chest pain. I arranged a coronary CTA which showed diffuse calcific CAD with moderate disease in the LAD and RCA. Echo overall normal. Cardiac cath 12/25/14 with moderate CAD but no focally obstructive lesions.   He is here today for follow up. The patient denies any chest pain, dyspnea, palpitations, lower extremity edema, orthopnea, PND, dizziness, near syncope or syncope. He is walking every day.   Primary Care Physician: Marijo File, MD  Past Medical History:  Diagnosis Date  . Chronic respiratory failure ([place])   . COPD (chronic obstructive pulmonary disease) ([place])   . Coronary artery disease 12/2014   a. nonobstructive CAD 12/2014.  . Emphysema (subcutaneous) (surgical) resulting from a procedure   . Emphysema of lung ([place])   . History of home oxygen therapy   . History of weight change   . LBBB (left bundle branch block)   . Sexual dysfunction   . Sneezing     Past Surgical History:  Procedure Laterality Date  . APPENDECTOMY    . CARDIAC CATHETERIZATION N/A 12/25/2014   Procedure: Left Heart Cath and Coronary Angiography;  Surgeon: Burnell Blanks, MD;  Location: [facility];  Service: Cardiovascular;  Laterality: N/A;    Current Outpatient Medications  Medication Sig Dispense Refill  . albuterol (PROVENTIL HFA;VENTOLIN HFA) 108 (90 Base) MCG/ACT inhaler Inhale 2 puffs into the lungs every 6 (six) hours as needed for wheezing. 18 g 3  . albuterol (PROVENTIL)  (2.5 MG/3ML) 0.083% nebulizer solution Take 3 mLs (2.5 mg total) by nebulization every 6 (six) hours as needed for wheezing or shortness of breath. 120 vial 3  . aspirin 81 MG tablet Take 81 mg by mouth every other day.     . fluticasone (FLONASE) 50 MCG/ACT nasal spray Place 2 sprays into both nostrils daily as needed for allergies or rhinitis.    Marland Kitchen ibuprofen (ADVIL,MOTRIN) 200 MG tablet Take 600 mg by mouth every 6 (six) hours as needed (pain).    . Multiple Vitamin (MULTIVITAMIN) tablet Take 1 tablet by mouth daily.      . OXYGEN 2.5 lpm with sleep "as needed" [facility]    . Respiratory Therapy Supplies (FLUTTER) DEVI Use as directed. 1 each 0  . rosuvastatin (CRESTOR) 5 MG tablet Take 1 tablet (5 mg total) by mouth every 3 (three) days. 30 tablet 0   No current facility-administered medications for this visit.     No Known Allergies  Social History   Socioeconomic History  . Marital status: Married    Spouse name: Not on file  . Number of children: 1  . Years of education: Not on file  . Highest education level: Not on file  Social Needs  . Financial resource strain: Not on file  . Food insecurity - worry: Not on file  . Food insecurity - inability: Not on file  . Transportation needs - medical: Not on file  . Transportation needs - non-medical: Not  on file  Occupational History  . Occupation: Associate Professor  Tobacco Use  . Smoking status: Former Smoker    Packs/day: 2.00    Years: 40.00    Pack years: 80.00    Types: Cigarettes    Last attempt to quit: 03/14/2006    Years since quitting: 11.2  . Smokeless tobacco: Never Used  Substance and Sexual Activity  . Alcohol use: No    Alcohol/week: 0.0 oz  . Drug use: No  . Sexual activity: Not on file  Other Topics Concern  . Not on file  Social History Narrative   Originally from [place]. Always lived in [place]. Previously has traveled to [place], [place], [place], [place], [place], [place], [place]. No international travel. 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 No pets currently. No bird exposure. No mold exposure. No asbestos exposure.     Family History  Problem Relation Age of Onset  . Heart disease Mother        rheumatic fever  . Rheumatic fever Mother   . Emphysema Father   . Heart attack Maternal Grandfather   . Lung cancer Paternal Uncle     Review of Systems:  As stated in the HPI and otherwise negative.   BP 122/66   Pulse 79   Ht _0  (1.753 m)   Wt 149 lb 6.4 oz (67.8 kg)   BMI 22.06 kg/m   Physical Examination:  General: Well developed, well nourished, NAD  HEENT: OP clear, mucus membranes moist  SKIN: warm, dry. No rashes. Neuro: No focal deficits  Musculoskeletal: Muscle strength 5/5 all ext  Psychiatric: Mood and affect normal  Neck: No JVD, no carotid bruits, no thyromegaly, no lymphadenopathy.  Lungs:Clear bilaterally, no wheezes, rhonci, crackles Cardiovascular: Regular rate and rhythm. No murmurs, gallops or rubs. Abdomen:Soft. Bowel sounds present. Non-tender.  Extremities: No lower extremity edema. Pulses are 2 + in the bilateral DP/PT.  Cardiac cath 12/25/14:  Prox RCA lesion, 40% stenosed.  Mid RCA to Dist RCA lesion, 40% stenosed.  RPDA lesion, 40% stenosed.  Mid Cx to Dist Cx lesion, 40% stenosed.  Prox LAD to Mid LAD lesion, 40% stenosed.  Mid LAD to Dist LAD lesion, 20% stenosed.  Ost 2nd Diag lesion, 60% stenosed.  Ost 1st Diag to 1st Diag lesion, 60% stenosed.  The left ventricular systolic function is normal.  1. Triple vessel CAD  2. Moderate non-obstructive disease in the RCA, LAD, small caliber diagonal branches and Circumflex artery.  3. Normal LV systolic function.   Echo 12/12/14: Left ventricle: Abnormal septal motion from LBBB Normal GLS -20. The cavity size was normal. Systolic function was normal. The estimated ejection fraction was 55%. Wall motion was normal;  there were no regional wall motion abnormalities. - Left atrium: The atrium was mildly dilated. - Atrial septum: No defect or patent foramen ovale was identified.  EKG:  EKG is ordered today. The ekg ordered today demonstrates NSR, rate 79 bpm. LBBB  Recent Labs: No results found for requested labs within last 8760 hours.   Lipid Panel    Component Value Date/Time   CHOL 133 06/04/2015 0800   TRIG 84 06/04/2015 0800   HDL 39 (L) 06/04/2015 0800   CHOLHDL 3.4 06/04/2015 0800   VLDL 17 06/04/2015 0800   LDLCALC 77 06/04/2015 0800     Wt Readings from Last 3 Encounters:  05/24/17 149 lb 6.4  oz (67.8 kg)  05/02/17 151 lb (68.5 kg)  11/03/16 154 lb (69.9 kg)     Other studies Reviewed: Additional studies/ records that were reviewed today include: . Review of the above records demonstrates:    Assessment and Plan:   1. CAD without angina: No chest pain suggestive of angina. He is known to have moderate CAD by cath in October 2016. I will continue ASA and statin.   2. LBBB: Chronic.   3. Tobacco abuse, in remission: He stopped smoking in 2008. He has not restarted smoking.   Current medicines are reviewed at length with the patient today.  The patient does not have concerns regarding medicines.  The following changes have been made:  no change  Labs/ tests ordered today include:   Orders Placed This Encounter  Procedures  . EKG 12-Lead    Disposition:   FU with me in 12 months   Signed, Lauree Chandler, MD 05/24/2017 12:32 PM    [facility] [address], [place], [place]  [address] Phone: [phone]; Fax: [phone]

## 2017-05-24 NOTE — Patient Instructions (Signed)

## 2017-06-06 ENCOUNTER — Other Ambulatory Visit: Payer: Self-pay | Admitting: Cardiovascular Disease

## 2017-06-12 ENCOUNTER — Other Ambulatory Visit: Payer: Self-pay | Admitting: Internal Medicine

## 2017-06-13 ENCOUNTER — Telehealth: Payer: Self-pay | Admitting: Internal Medicine

## 2017-06-13 NOTE — Telephone Encounter (Signed)
atc pt X3, line rang to fast busy signal.   Pt was changed from Symbicort to Bevespi on 2/19 per OV note.   wcb.

## 2017-06-14 MED ORDER — BUDESONIDE-FORMOTEROL FUMARATE 160-4.5 MCG/ACT IN AERO: 2.0000 | INHALATION_SPRAY | Freq: Two times a day (BID) | RESPIRATORY_TRACT | 6 refills | Status: DC

## 2017-06-14 NOTE — Telephone Encounter (Signed)
Left message for Jamie to call back 

## 2017-06-14 NOTE — Telephone Encounter (Signed)
Jamie,pt wife returning call, CB is [phone].

## 2017-06-14 NOTE — Telephone Encounter (Signed)
Spoke with the pt's spouse  She states that the pt tried the [place] and it gave him HA  He went back to the symbicort but is now out of med  OK to switch back?   Last AVS:                      Plan A = Automatic = Change symbicort to Bevespi Take 2 puffs first thing in am and then another 2 puffs about 12 hours later.  Work on inhaler technique: relax and gently blow all the way out then take a nice smooth deep breath back in, triggering the inhaler at same time you start breathing in. Hold for up to 5 seconds if you can. . Rinse and gargle with water when done  Plan B = Backup Only use your albuterol (proair red/ proventil yellow) as a rescue medication to be used if you can't catch your breath by resting or doing a relaxed purse lip breathing pattern.  - The less you use it, the better it will work when you need it. - Ok to use the inhaler up to 2 puffs every 4 hours if you must but call for appointment if use goes up over your usual need - Don't leave home without it !! (think of it like the spare tire for your car)  Plan C = Crisis - only use your albuterol nebulizer if you first try Plan B and it fails to help > ok to use the nebulizer up to every 4 hours but if start needing it regularly call for immediate appointment  Plan D = Doctor - call me if B and C not adequate  Plan E = ER - go to ER or call 911 if all else fails

## 2017-06-14 NOTE — Telephone Encounter (Signed)
Yes fine with me to change back to symbicor 160 2bid

## 2017-06-14 NOTE — Telephone Encounter (Signed)
Called and spoke with patients wife, advised her of MW response. Medication symbicort has been sent to patients pharmacy.

## 2017-06-20 ENCOUNTER — Encounter: Payer: Self-pay | Admitting: Internal Medicine

## 2017-06-20 ENCOUNTER — Inpatient Hospital Stay: Admission: RE | Admit: 2017-06-20 | Payer: Medicare HMO | Source: Ambulatory Visit

## 2017-06-20 ENCOUNTER — Ambulatory Visit (INDEPENDENT_AMBULATORY_CARE_PROVIDER_SITE_OTHER)
Admission: RE | Admit: 2017-06-20 | Discharge: 2017-06-20 | Disposition: A | Discharge status: Home / Self Care | Payer: Medicare HMO | Source: Ambulatory Visit | Attending: Internal Medicine | Admitting: Internal Medicine

## 2017-06-20 ENCOUNTER — Other Ambulatory Visit (INDEPENDENT_AMBULATORY_CARE_PROVIDER_SITE_OTHER): Payer: Medicare HMO

## 2017-06-20 ENCOUNTER — Ambulatory Visit: Payer: Medicare HMO | Admitting: Internal Medicine

## 2017-06-20 VITALS — BP 112/74 | HR 79 | Temp 97.7°F | Ht 69.0 in | Wt 149.8 lb

## 2017-06-20 DIAGNOSIS — R058 Other specified cough: Secondary | ICD-10-CM

## 2017-06-20 DIAGNOSIS — R05 Cough: Secondary | ICD-10-CM

## 2017-06-20 DIAGNOSIS — J449 Chronic obstructive pulmonary disease, unspecified: Secondary | ICD-10-CM | POA: Diagnosis not present

## 2017-06-20 DIAGNOSIS — R059 Cough, unspecified: Secondary | ICD-10-CM

## 2017-06-20 DIAGNOSIS — J9611 Chronic respiratory failure with hypoxia: Secondary | ICD-10-CM

## 2017-06-20 LAB — CBC WITH DIFFERENTIAL/PLATELET
BASOS PCT: 0.9 % (ref 0.0–3.0)
Basophils Absolute: 0.1 10*3/uL (ref 0.0–0.1)
EOS PCT: 1 % (ref 0.0–5.0)
Eosinophils Absolute: 0.1 10*3/uL (ref 0.0–0.7)
HCT: 43.5 % (ref 39.0–52.0)
Hemoglobin: 15 g/dL (ref 13.0–17.0)
LYMPHS ABS: 1 10*3/uL (ref 0.7–4.0)
Lymphocytes Relative: 12.2 % (ref 12.0–46.0)
MCHC: 34.4 g/dL (ref 30.0–36.0)
MCV: 89.3 fl (ref 78.0–100.0)
MONO ABS: 0.5 10*3/uL (ref 0.1–1.0)
Monocytes Relative: 6.5 % (ref 3.0–12.0)
NEUTROS ABS: 6.4 10*3/uL (ref 1.4–7.7)
NEUTROS PCT: 79.4 % — AB (ref 43.0–77.0)
PLATELETS: 236 10*3/uL (ref 150.0–400.0)
RBC: 4.86 Mil/uL (ref 4.22–5.81)
RDW: 14 % (ref 11.5–15.5)
WBC: 8 10*3/uL (ref 4.0–10.5)

## 2017-06-20 MED ORDER — PANTOPRAZOLE SODIUM 40 MG PO TBEC: 40.0000 mg | DELAYED_RELEASE_TABLET | Freq: Every day | ORAL | 2 refills | Status: DC

## 2017-06-20 MED ORDER — FAMOTIDINE 20 MG PO TABS: ORAL_TABLET | ORAL | 11 refills | Status: AC

## 2017-06-20 MED ORDER — BUDESONIDE-FORMOTEROL FUMARATE 160-4.5 MCG/ACT IN AERO: 2.0000 | INHALATION_SPRAY | Freq: Two times a day (BID) | RESPIRATORY_TRACT | 0 refills | Status: DC

## 2017-06-20 MED ORDER — PREDNISONE 10 MG PO TABS: ORAL_TABLET | ORAL | 0 refills | Status: DC

## 2017-06-20 NOTE — Progress Notes (Signed)
Subjective:    Patient ID: Russell Schaefer, male    DOB: 06/24/1950, 67 y.o.   MRN: [id number]  C.C.:  Follow-up for Very Severe COPD w/ Emphysema & Chronic Hypoxic Respiratory Failure.  HPI Very severe COPD with emphysema: Previously intolerant of Spiriva.   Chronic hypoxic respiratory failure: Previously prescribed oxygen for use with pulmonary rehabilitation & home exercise. He is still using oxygen with his regimen.     Brief patient profile:  66 yowm MM quit smoking 2008 followed for COPD GOLD III on noct 02 2.5 lpm  Intol of spiriva powder mouth broke out    History of Present Illness  05/02/2017  Extended f/u ov/Aliyanna Wassmer re: transition of care from Dr Jamison Neighbor Chief Complaint  Patient presents with  . Follow-up    Former Dr Jamison Neighbor pt. Pt states his breathing is unchanged since last visit. He is using his albuterol 3 x per wk and neb with albuterol 4 x per wk on average.   Dyspnea: gxt most evening 20-25 min 2 - 2.2 mph and level grade  s stopping and s 02  Cough: no am flare/ sporadic Sleep: ok on /off 2.5lpm  rec Plan A = Automatic =  Change symbicort to Bevespi Take 2 puffs first thing in am and then another 2 puffs about 12 hours later.  Work on inhaler technique:   Plan B = Backup Only use your albuterol (proair red/ proventil yellow) as a rescue medication )  Plan C = Crisis - only use your albuterol nebulizer if you first try Plan B and it fails to help > ok to use the nebulizer up to every 4 hours but if start needing it regularly call for immediate appointment  Use 02 2.5 lpm at bedtime and on treadmill    06/13/17 Phone call: reported HA from bevespi > rec change back to symb 160 2bid   06/20/2017  f/u ov/Zaylee Cornia re: copd  GOLD III 02 dep at hs  Chief Complaint  Patient presents with  . Acute Visit    Increased SOB and wheezing for the past 2 wks. He has had some cough with white sputum. He is using his albuterol inhaler 1-2 x per day and neb at least once per day.    Dyspnea:  Still doing ok on the treadmill with the 02 but more subj wheezing assoc with lots of nasal congestions x 2 weeks Cough: worse after supper min prod Sleep: ok  SABA use:  Overuse as above    No  obvious day to day or daytime variability or assoc excess/ purulent sputum or mucus plugs or hemoptysis or cp or chest tightness, subjective wheeze or overt sinus or hb symptoms. No unusual exposure hx or h/o childhood pna/ asthma or knowledge of premature birth.  Sleeping ok flat without nocturnal  or early am exacerbation  of respiratory  c/o's or need for noct saba. Also denies any obvious fluctuation of symptoms with weather or environmental changes or other aggravating or alleviating factors except as outlined above   Current Allergies, Complete Past Medical History, Past Surgical History, Family History, and Social History were reviewed in [employer] record.  ROS  The following are not active complaints unless bolded Hoarseness, sore throat, dysphagia, dental problems, itching, sneezing,  nasal congestion or discharge of excess mucus or purulent secretions, ear ache,   fever, chills, sweats, unintended wt loss or wt gain, classically pleuritic or exertional cp,  orthopnea pnd or leg swelling, presyncope, palpitations,  abdominal pain, anorexia, nausea, vomiting, diarrhea  or change in bowel habits or change in bladder habits, change in stools or change in urine, dysuria, hematuria,  rash, arthralgias, visual complaints, headache, numbness, weakness or ataxia or problems with walking or coordination,  change in mood/affect or memory.        Current Meds  Medication Sig  . albuterol (PROVENTIL HFA;VENTOLIN HFA) 108 (90 Base) MCG/ACT inhaler Inhale 2 puffs into the lungs every 6 (six) hours as needed for wheezing.  Marland Kitchen albuterol (PROVENTIL) (2.5 MG/3ML) 0.083% nebulizer solution Take 3 mLs (2.5 mg total) by nebulization every 6 (six) hours as needed for wheezing or  shortness of breath.  Marland Kitchen aspirin 81 MG tablet Take 81 mg by mouth every other day.   . budesonide-formoterol (SYMBICORT) 160-4.5 MCG/ACT inhaler Inhale 2 puffs into the lungs 2 (two) times daily.  . fluticasone (FLONASE) 50 MCG/ACT nasal spray Place 2 sprays into both nostrils daily as needed for allergies or rhinitis.  Marland Kitchen ibuprofen (ADVIL,MOTRIN) 200 MG tablet Take 600 mg by mouth every 6 (six) hours as needed (pain).  . Multiple Vitamin (MULTIVITAMIN) tablet Take 1 tablet by mouth daily.    . OXYGEN 2.5 lpm with sleep "as needed" [employer]  . Respiratory Therapy Supplies (FLUTTER) DEVI Use as directed.  . rosuvastatin (CRESTOR) 5 MG tablet TAKE 1 TABLET EVERY 3 DAYS                     Objective:   Physical Exam    06/20/2017         149   05/02/17 151 lb (68.5 kg)  11/03/16 154 lb (69.9 kg)  04/18/16 154 lb (69.9 kg)    amb wm with nasal tone to vocie  Vital signs reviewed - Note on arrival 02 sats  92% on RA       HEENT: nl dentition / oropharynx. Nl external ear canals without cough reflex - moderate bilateral non-specific turbinate edema     NECK :  without JVD/Nodes/TM/ nl carotid upstrokes bilaterally   LUNGS: no acc muscle use,  Mod barrel  contour chest wall with bilateral  Insp/exp distant wheeze and  without cough on insp or exp maneuver and mod   Hyperresonant  to  percussion bilaterally     CV:  RRR  no s3 or murmur or increase in P2, and no edema   ABD:  soft and nontender with pos mid insp Hoover's  in the supine position. No bruits or organomegaly appreciated, bowel sounds nl  MS:   Nl gait/  ext warm without deformities, calf tenderness, cyanosis or clubbing No obvious joint restrictions   SKIN: warm and dry without lesions    NEURO:  alert, approp, nl sensorium with  no motor or cerebellar deficits apparent.                PFT 10/12/15: FVC 3.14 L (70%) FEV1 1.43 L (43%) FEV1/FVC 0.45 FEF 25-75 0.53 L (20%) negative bronchodilator  response                                                                    DLCO corrected 37% (Hgb 13.2) 06/22/15: FVC 2.99 L (67%) FEV1 1.31 L (39%) FEV1/FVC 0.44  FEF 25-75 0.45 L (17%) negative bronchodilator response TLC 6.92 L (101%) RV 163% ERV 114% DLCO corrected 30%  08/16/13: FVC 2.87 L (63%) FEV1 1.17 L (33%) FEV1/FVC 0.41 FEF 25-75 0.41 L (12%) 08/31/11: FVC 2.96 L (65%) FEV1 1.43 L (40%) FEV1/FVC 0.48 FEF 25-75 0.46 L (14%)  10/12/15:  Walked 360 meters / Baseline Sat 92% on RA / Nadir Sat 91% on RA 06/22/15: Walked 413 meters / Baseline Sat 94% on RA / Nadir Sat 86% on RA      LABS 04/21/15 Alpha-1 Antitrypsin: MM (161)    CXR PA and Lateral:   06/20/2017 :    I personally reviewed images and   impression as follows:  Copd/coarse markings s change vs priors      Labs ordered 06/20/2017  Allergy profile      Assessment & Plan:

## 2017-06-20 NOTE — Patient Instructions (Addendum)
Pantoprazole (protonix) 40 mg   Take  30-60 min before first meal of the day and Pepcid (famotidine)  20 mg one after supper  until return to office - this is the best way to tell whether stomach acid is contributing to your problem.     Prednisone 10 mg take  4 each am x 2 days,   2 each am x 2 days,  1 each am x 2 days and stop    Plan A = Automatic = symbicort 160 2bid and flonase one puff one twice daily   Plan B = Backup Only use your albuterol as a rescue medication to be used if you can't catch your breath by resting or doing a relaxed purse lip breathing pattern.  - The less you use it, the better it will work when you need it. - Ok to use the inhaler up to 2 puffs  every 4 hours if you must but call for appointment if use goes up over your usual need - Don't leave home without it !!  (think of it like the spare tire for your car)   Plan C = Crisis - only use your albuterol nebulizer if you first try Plan B and it fails to help > ok to use the nebulizer up to every 4 hours but if start needing it regularly call for immediate appointment   Please see patient coordinator before you leave today  to schedule sinus CT    Please remember to go to the lab and x-ray department downstairs in the basement  for your tests - we will call you with the results when they are available.   Please schedule a follow up office visit in 4 weeks, sooner if needed

## 2017-06-21 ENCOUNTER — Encounter: Payer: Self-pay | Admitting: Internal Medicine

## 2017-06-21 LAB — RESPIRATORY ALLERGY PROFILE REGION II ~~LOC~~
Allergen, Cottonwood, t14: <0.1 kU/L
Allergen, D pternoyssinus,d7: <0.1 kU/L
Allergen, Mouse Urine Protein, e78: <0.1 kU/L
Allergen, Oak,t7: <0.1 kU/L
Allergen, P. notatum, m1: <0.1 kU/L
Box Elder IgE: <0.1 kU/L
CLASS: 0
CLASS: 0
CLASS: 0
CLASS: 0
CLASS: 0
CLASS: 0
CLASS: 0
CLASS: 0
Cat Dander: <0.1 kU/L
Class: 0
Class: 0
Class: 0
Class: 0
Class: 0
Class: 0
Class: 0
Class: 0
Class: 0
Class: 0
Class: 0
Class: 0
Class: 0
Class: 0
Class: 0
Class: 0
Cockroach: <0.1 kU/L
D. farinae: 0.23 kU/L — ABNORMAL HIGH
Elm IgE: <0.1 kU/L
IgE (Immunoglobulin E), Serum: 76 kU/L (ref <=114)
Johnson Grass: <0.1 kU/L
Timothy Grass: <0.1 kU/L

## 2017-06-21 LAB — INTERPRETATION:

## 2017-06-21 NOTE — Assessment & Plan Note (Addendum)
05/02/2017  Walked RA x 3 laps @ 185 ft each stopped due to  End of study, nl pace, desats to 86% s sob   06/20/2017 rec 2lpm sleeping and with exertion

## 2017-06-21 NOTE — Assessment & Plan Note (Signed)
Allergy profile 06/20/2017 >  Eos 0.1 /  IgE   - sinus CT ordered   Upper airway cough syndrome (previously labeled PNDS),  is so named because it's frequently impossible to sort out how much is  CR/sinusitis with freq throat clearing (which can be related to primary GERD)   vs  causing  secondary (" extra esophageal")  GERD from wide swings in gastric pressure that occur with throat clearing, often  promoting self use of mint and menthol lozenges that reduce the lower esophageal sphincter tone and exacerbate the problem further in a cyclical fashion.   These are the same pts (now being labeled as having "irritable larynx syndrome" by some cough centers) who not infrequently have a history of having failed to tolerate ace inhibitors,  dry powder inhalers or biphosphonates or report having atypical/extraesophageal reflux symptoms that don't respond to standard doses of PPI  and are easily confused as having aecopd or asthma flares by even experienced allergists/ pulmonologists (myself included).   Will add gerd rx empirically pending above results and regroup in 4-6 weeks

## 2017-06-21 NOTE — Progress Notes (Signed)
Spoke with the pt's spouse and notified of results and she verbalized understanding.

## 2017-06-21 NOTE — Assessment & Plan Note (Addendum)
Quit smoking 2008  08/31/2011  Spirometry FeV1 40%  FeV1/FVC 61%  - 10/12/15: FVC 3.14 L (70%) FEV1 1.43 L (43%) FEV1/FVC 0.45 FEF 25-75 0.53 L (20%) negative bronchodilator response   DLCO corrected 37% - 05/02/2017  > try change symb to bevespi > HA > resolved back on symb 160   - 06/20/2017  After extensive coaching inhaler device  effectiveness =    75%    Low grade exac assoc with rhinitis > check profile/ Prednisone 10 mg take  4 each am x 2 days,   2 each am x 2 days,  1 each am x 2 days      consider adding LAMA then if not back to baseline   I had an extended discussion with the patient reviewing all relevant studies completed to date and  lasting 15 to 20 minutes of a 25 minute visit    Each maintenance medication was reviewed in detail including most importantly the difference between maintenance and prns and under what circumstances the prns are to be triggered using an action plan format that is not reflected in the computer generated alphabetically organized AVS.    Please see AVS for specific instructions unique to this visit that I personally wrote and verbalized to the the pt in detail and then reviewed with pt  by my nurse highlighting any  changes in therapy recommended at today's visit to their plan of care.

## 2017-06-22 NOTE — Progress Notes (Signed)
ATC, NA and no option to leave msg 

## 2017-06-23 NOTE — Progress Notes (Signed)
Spoke with pt's spouse and notified of results/recs

## 2017-06-26 ENCOUNTER — Ambulatory Visit (INDEPENDENT_AMBULATORY_CARE_PROVIDER_SITE_OTHER)
Admission: RE | Admit: 2017-06-26 | Discharge: 2017-06-26 | Disposition: A | Discharge status: Home / Self Care | Payer: Medicare HMO | Source: Ambulatory Visit | Attending: Internal Medicine | Admitting: Internal Medicine

## 2017-06-26 DIAGNOSIS — R05 Cough: Secondary | ICD-10-CM | POA: Diagnosis not present

## 2017-06-26 DIAGNOSIS — R059 Cough, unspecified: Secondary | ICD-10-CM

## 2017-06-26 DIAGNOSIS — R058 Other specified cough: Secondary | ICD-10-CM

## 2017-06-27 NOTE — Progress Notes (Signed)
ATC, NA and no option to leave msg 

## 2017-07-18 ENCOUNTER — Encounter: Payer: Self-pay | Admitting: Internal Medicine

## 2017-07-18 ENCOUNTER — Ambulatory Visit: Payer: Medicare HMO | Admitting: Internal Medicine

## 2017-07-18 VITALS — BP 120/82 | HR 77 | Ht 69.0 in | Wt 148.8 lb

## 2017-07-18 DIAGNOSIS — R05 Cough: Secondary | ICD-10-CM | POA: Diagnosis not present

## 2017-07-18 DIAGNOSIS — J9611 Chronic respiratory failure with hypoxia: Secondary | ICD-10-CM | POA: Diagnosis not present

## 2017-07-18 DIAGNOSIS — J449 Chronic obstructive pulmonary disease, unspecified: Secondary | ICD-10-CM | POA: Diagnosis not present

## 2017-07-18 DIAGNOSIS — R058 Other specified cough: Secondary | ICD-10-CM

## 2017-07-18 MED ORDER — BUDESONIDE-FORMOTEROL FUMARATE 160-4.5 MCG/ACT IN AERO: 2.0000 | INHALATION_SPRAY | Freq: Two times a day (BID) | RESPIRATORY_TRACT | 0 refills | Status: AC

## 2017-07-18 NOTE — Patient Instructions (Signed)
Change to pepcid to where you take it after supper not at bedtime  Go ahead and take the pm dose of symbiocrt at 11 or 12 hour mark

## 2017-07-18 NOTE — Progress Notes (Signed)
Subjective:    Patient ID: Russell Schaefer, male    DOB: 01-02-1951    MRN: [id number]     HPI Very severe COPD with emphysema: Previously intolerant of Spiriva.   Chronic hypoxic respiratory failure: Previously prescribed oxygen for use with pulmonary rehabilitation & home exercise.     Brief patient profile:  66 yowm MM quit smoking 2008 followed for COPD GOLD III on noct 02 2.5 lpm  Intol of spiriva powder mouth broke out    History of Present Illness  05/02/2017  Extended f/u ov/Russell Schaefer re: transition of care from Dr Jamison Neighbor Chief Complaint  Patient presents with  . Follow-up    Former Dr Jamison Neighbor pt. Pt states his breathing is unchanged since last visit. He is using his albuterol 3 x per wk and neb with albuterol 4 x per wk on average.   Dyspnea: gxt most evening 20-25 min 2 - 2.2 mph and level grade  s stopping and s 02  Cough: no am flare/ sporadic Sleep: ok on /off 2.5lpm  rec Plan A = Automatic =  Change symbicort to Bevespi Take 2 puffs first thing in am and then another 2 puffs about 12 hours later.  Work on inhaler technique:   Plan B = Backup Only use your albuterol (proair red/ proventil yellow) as a rescue medication )  Plan C = Crisis - only use your albuterol nebulizer if you first try Plan B and it fails to help > ok to use the nebulizer up to every 4 hours but if start needing it regularly call for immediate appointment  Use 02 2.5 lpm at bedtime and on treadmill    06/13/17 Phone call: reported HA from bevespi > rec change back to symb 160 2bid   06/20/2017  f/u ov/Russell Schaefer re: copd  GOLD III 02 dep at hs  On symb  Chief Complaint  Patient presents with  . Acute Visit    Increased SOB and wheezing for the past 2 wks. He has had some cough with white sputum. He is using his albuterol inhaler 1-2 x per day and neb at least once per day.  Dyspnea:  Still doing ok on the treadmill with the 02 but more subj wheezing assoc with lots of nasal congestions x 2 weeks Cough:  worse after supper min prod Sleep: ok  SABA use:  Overuse as above rec Pantoprazole (protonix) 40 mg   Take  30-60 min before first meal of the day and Pepcid (famotidine)  20 mg one after supper  until return to office - this is the best way to tell whether stomach acid is contributing to your problem.   Prednisone 10 mg take  4 each am x 2 days,   2 each am x 2 days,  1 each am x 2 days and stop  Plan A = Automatic = symbicort 160 2bid and flonase one puff one twice daily  Plan B = Backup Only use your albuterol as a rescue medication Plan C = Crisis - only use your albuterol nebulizer if you first try Plan B and it fails to help > ok to use the nebulizer up to every 4 hours but if start needing it regularly call for immediate appointment      07/18/2017  f/u ov/Russell Schaefer re: copd III/ improved sob  On symb 160 2bid  Chief Complaint  Patient presents with  . Follow-up    Breathing has improved to his normal baseline. He is still coughing  some and has had minimal wheezing. He uses the albuterol inhaler 3 x per on average and has not needed neb.   Dyspnea:  Treadmill x 20 min s stopping 2-2.2 flat better when wears 02  Cough: mostly after supper  Sleep: ok SABA use:   3 x per week    No obvious day to day or daytime variability or assoc excess/ purulent sputum or mucus plugs or hemoptysis or cp or chest tightness,   or overt sinus or hb symptoms. No unusual exposure hx or h/o childhood pna/ asthma or knowledge of premature birth.  Sleeping  ok  without nocturnal  or early am exacerbation  of respiratory  c/o's or need for noct saba. Also denies any obvious fluctuation of symptoms with weather or environmental changes or other aggravating or alleviating factors except as outlined above   Current Allergies, Complete Past Medical History, Past Surgical History, Family History, and Social History were reviewed in [employer] record.  ROS  The following are not active  complaints unless bolded Hoarseness, sore throat, dysphagia, dental problems, itching, sneezing,  nasal congestion or discharge of excess mucus or purulent secretions, ear ache,   fever, chills, sweats, unintended wt loss or wt gain, classically pleuritic or exertional cp,  orthopnea pnd or arm/hand swelling  or leg swelling, presyncope, palpitations, abdominal pain, anorexia, nausea, vomiting, diarrhea  or change in bowel habits or change in bladder habits, change in stools or change in urine, dysuria, hematuria,  rash, arthralgias, visual complaints, headache, numbness, weakness or ataxia or problems with walking or coordination,  change in mood or  memory.        Current Meds  Medication Sig  . albuterol (PROVENTIL HFA;VENTOLIN HFA) 108 (90 Base) MCG/ACT inhaler Inhale 2 puffs into the lungs every 6 (six) hours as needed for wheezing.  Marland Kitchen albuterol (PROVENTIL) (2.5 MG/3ML) 0.083% nebulizer solution Take 3 mLs (2.5 mg total) by nebulization every 6 (six) hours as needed for wheezing or shortness of breath.  Marland Kitchen aspirin 81 MG tablet Take 81 mg by mouth every other day.   . budesonide-formoterol (SYMBICORT) 160-4.5 MCG/ACT inhaler Inhale 2 puffs into the lungs 2 (two) times daily.  . famotidine (PEPCID) 20 MG tablet One at bedtime  . fluticasone (FLONASE) 50 MCG/ACT nasal spray Place 2 sprays into both nostrils daily as needed for allergies or rhinitis.  Marland Kitchen ibuprofen (ADVIL,MOTRIN) 200 MG tablet Take 600 mg by mouth every 6 (six) hours as needed (pain).  . Multiple Vitamin (MULTIVITAMIN) tablet Take 1 tablet by mouth daily.    . OXYGEN 2.5 lpm with sleep "as needed" [employer]  . pantoprazole (PROTONIX) 40 MG tablet Take 1 tablet (40 mg total) by mouth daily. Take 30-60 min before first meal of the day  . Respiratory Therapy Supplies (FLUTTER) DEVI Use as directed.  . rosuvastatin (CRESTOR) 5 MG tablet TAKE 1 TABLET EVERY 3 DAYS  .                             Objective:   Physical  Exam    07/18/2017          148   06/20/2017         149   05/02/17 151 lb (68.5 kg)  11/03/16 154 lb (69.9 kg)  04/18/16 154 lb (69.9 kg)    amb wm nad   Vital signs reviewed - Note on arrival 02 sats  92%  on RA       HEENT: nl dentition / oropharynx. Nl external ear canals without cough reflex - moderate bilateral non-specific turbinate edema     NECK :  without JVD/Nodes/TM/ nl carotid upstrokes bilaterally   LUNGS: no acc muscle use,  Mod barrel  contour chest wall with bilateral  Distant bs s audible wheeze and  without cough on insp or exp maneuver and mod   Hyperresonant  to  percussion bilaterally     CV:  RRR  no s3 or murmur or increase in P2, and no edema   ABD:  soft and nontender with pos mid insp Hoover's  in the supine position. No bruits or organomegaly appreciated, bowel sounds nl  MS:   Nl gait/  ext warm without deformities, calf tenderness, cyanosis or clubbing No obvious joint restrictions   SKIN: warm and dry without lesions    NEURO:  alert, approp, nl sensorium with  no motor or cerebellar deficits apparent.                PFT 10/12/15: FVC 3.14 L (70%) FEV1 1.43 L (43%) FEV1/FVC 0.45 FEF 25-75 0.53 L (20%) negative bronchodilator response                                                                    DLCO corrected 37% (Hgb 13.2) 06/22/15: FVC 2.99 L (67%) FEV1 1.31 L (39%) FEV1/FVC 0.44 FEF 25-75 0.45 L (17%) negative bronchodilator response TLC 6.92 L (101%) RV 163% ERV 114% DLCO corrected 30%  08/16/13: FVC 2.87 L (63%) FEV1 1.17 L (33%) FEV1/FVC 0.41 FEF 25-75 0.41 L (12%) 08/31/11: FVC 2.96 L (65%) FEV1 1.43 L (40%) FEV1/FVC 0.48 FEF 25-75 0.46 L (14%)  10/12/15:  Walked 360 meters / Baseline Sat 92% on RA / Nadir Sat 91% on RA 06/22/15: Walked 413 meters / Baseline Sat 94% on RA / Nadir Sat 86% on RA      LABS 04/21/15 Alpha-1 Antitrypsin: MM (161)              Assessment & Plan:

## 2017-07-19 ENCOUNTER — Encounter: Payer: Self-pay | Admitting: Internal Medicine

## 2017-07-19 NOTE — Assessment & Plan Note (Signed)
05/02/2017  Walked RA x 3 laps @ 185 ft each stopped due to  End of study, nl pace, desats to 86% s sob   07/18/2017 rec 2lpm sleeping and with exertion with goal of keeping the oxygen saturation above 90% at all times.

## 2017-07-19 NOTE — Assessment & Plan Note (Signed)
Allergy profile 06/20/2017 >  Eos 0.1 /  IgE  76  Rast Pos dust only  - sinus CT 06/26/2017 >  Normal paranasal sinuses.  Suggesting taking Pepcid after supper since this seems to correlate with when his cough is worse and may suggest postprandial reflux.

## 2017-07-19 NOTE — Assessment & Plan Note (Signed)
Quit smoking 2008  08/31/2011  Spirometry FeV1 40%  FeV1/FVC 61%  04/21/15 Alpha-1 Antitrypsin: MM (161) - 10/12/15: FVC 3.14 L (70%) FEV1 1.43 L (43%) FEV1/FVC 0.45   negative bronchodilator response   DLCO corrected 37% - 05/02/2017  > try change symb to bevespi > HA > resolved back on symb 160   - 07/18/2017  After extensive coaching inhaler device  effectiveness =    75% (short Ti)   He is having more trouble if he pushes the Symbicort back to beyond 12 hours. I believe this is because it is wearing off and he is not getting the full advantage of the "open-air concept". That is, the more bronchodilated he is the easy reduce to take a deeper breathand get the third and fourth puff of the day and deeper down where it is intended.  He also may be having some reflux issues after supper sob suggested adding Pepcid after supper as well for now..  I had an extended discussion with the patient reviewing all relevant studies completed to date and  lasting 15 to 20 minutes of a 25 minute visit    See device teaching which extended face to face time for this visit   Each maintenance medication was reviewed in detail including most importantly the difference between maintenance and prns and under what circumstances the prns are to be triggered using an action plan format that is not reflected in the computer generated alphabetically organized AVS.    Please see AVS for specific instructions unique to this visit that I personally wrote and verbalized to the the pt in detail and then reviewed with pt  by my nurse highlighting any  changes in therapy recommended at today's visit to their plan of care.

## 2017-09-28 ENCOUNTER — Other Ambulatory Visit: Payer: Self-pay | Admitting: Internal Medicine

## 2017-09-28 DIAGNOSIS — R05 Cough: Secondary | ICD-10-CM

## 2017-09-28 DIAGNOSIS — R058 Other specified cough: Secondary | ICD-10-CM

## 2017-09-28 MED ORDER — PANTOPRAZOLE SODIUM 40 MG PO TBEC: 40.0000 mg | DELAYED_RELEASE_TABLET | Freq: Every day | ORAL | 2 refills | Status: DC

## 2017-10-27 ENCOUNTER — Telehealth: Payer: Self-pay | Admitting: Internal Medicine

## 2017-10-27 NOTE — Telephone Encounter (Signed)
Pt is scheduled for an upcoming appt with MW Monday, 8/19 at 12pm and looking at the appt notes, it has 6 month f/u with a PFT.  Pt did not get scheduled for the PFT.  Attempted to call pt to see if we could get him scheduled for the PFT but unable to reach pt. Left message for pt to return call x1.

## 2017-10-30 ENCOUNTER — Ambulatory Visit (INDEPENDENT_AMBULATORY_CARE_PROVIDER_SITE_OTHER): Payer: Medicare HMO | Admitting: Internal Medicine

## 2017-10-30 ENCOUNTER — Ambulatory Visit: Payer: Medicare HMO | Admitting: Internal Medicine

## 2017-10-30 ENCOUNTER — Encounter: Payer: Self-pay | Admitting: Internal Medicine

## 2017-10-30 VITALS — BP 118/72 | HR 87 | Ht 69.0 in | Wt 146.0 lb

## 2017-10-30 DIAGNOSIS — R05 Cough: Secondary | ICD-10-CM

## 2017-10-30 DIAGNOSIS — R058 Other specified cough: Secondary | ICD-10-CM

## 2017-10-30 DIAGNOSIS — R0609 Other forms of dyspnea: Secondary | ICD-10-CM | POA: Diagnosis not present

## 2017-10-30 DIAGNOSIS — J9611 Chronic respiratory failure with hypoxia: Secondary | ICD-10-CM

## 2017-10-30 DIAGNOSIS — J449 Chronic obstructive pulmonary disease, unspecified: Secondary | ICD-10-CM

## 2017-10-30 LAB — PULMONARY FUNCTION TEST
DL/VA % PRED: 40 %
DL/VA: 1.84 ml/min/mmHg/L
DLCO unc % pred: 30 %
DLCO unc: 9.61 ml/min/mmHg
FEF 25-75 POST: 0.73 L/s
FEF 25-75 Pre: 0.51 L/sec
FEF2575-%Change-Post: 43 %
FEF2575-%PRED-POST: 29 %
FEF2575-%PRED-PRE: 20 %
FEV1-%CHANGE-POST: 6 %
FEV1-%Pred-Post: 45 %
FEV1-%Pred-Pre: 42 %
FEV1-Post: 1.47 L
FEV1-Pre: 1.39 L
FEV1FVC-%Change-Post: -2 %
FEV1FVC-%Pred-Pre: 69 %
FEV6-%Change-Post: 10 %
FEV6-%Pred-Post: 69 %
FEV6-%Pred-Pre: 63 %
FEV6-Post: 2.88 L
FEV6-Pre: 2.61 L
FEV6FVC-%Change-Post: 0 %
FEV6FVC-%Pred-Post: 103 %
FEV6FVC-%Pred-Pre: 103 %
FVC-%Change-Post: 8 %
FVC-%PRED-POST: 67 %
FVC-%PRED-PRE: 62 %
FVC-POST: 2.96 L
FVC-PRE: 2.71 L
POST FEV1/FVC RATIO: 50 %
PRE FEV1/FVC RATIO: 51 %
PRE FEV6/FVC RATIO: 97 %
Post FEV6/FVC ratio: 97 %
RV % pred: 142 %
RV: 3.33 L
TLC % pred: 100 %
TLC: 6.88 L

## 2017-10-30 NOTE — Patient Instructions (Addendum)
Monitor your 02 to keep over level  90%  with exercise     If you are satisfied with your treatment plan,  let your doctor know and he/she can either refill your medications or you can return here when your prescription runs out.     If in any way you are not 100% satisfied,  please tell us.  If 100% better, tell your friends!  Pulmonary follow up is as needed

## 2017-10-30 NOTE — Progress Notes (Signed)
Subjective:    Patient ID: Russell Schaefer, male    DOB: 08-Sep-1950    MRN: [id number]    Brief patient profile:  56 yowm MM quit smoking 2008 followed for COPD GOLD III on noct 02 2.5 lpm  Intol of spiriva powder mouth broke out    History of Present Illness  05/02/2017  Extended f/u ov/Vin Yonke re: transition of care from Dr Jamison Neighbor Chief Complaint  Patient presents with  . Follow-up    Former Dr Jamison Neighbor pt. Pt states his breathing is unchanged since last visit. He is using his albuterol 3 x per wk and neb with albuterol 4 x per wk on average.   Dyspnea: gxt most evening 20-25 min 2 - 2.2 mph and level grade  s stopping and s 02  Cough: no am flare/ sporadic Sleep: ok on /off 2.5lpm  rec Plan A = Automatic =  Change symbicort to Bevespi Take 2 puffs first thing in am and then another 2 puffs about 12 hours later.  Work on inhaler technique:   Plan B = Backup Only use your albuterol (proair red/ proventil yellow) as a rescue medication )  Plan C = Crisis - only use your albuterol nebulizer if you first try Plan B and it fails to help > ok to use the nebulizer up to every 4 hours but if start needing it regularly call for immediate appointment  Use 02 2.5 lpm at bedtime and on treadmill    06/13/17 Phone call: reported HA from bevespi > rec change back to symb 160 2bid   06/20/2017  f/u ov/[name] re: copd  GOLD III 02 dep at hs  On symb  Chief Complaint  Patient presents with  . Acute Visit    Increased SOB and wheezing for the past 2 wks. He has had some cough with white sputum. He is using his albuterol inhaler 1-2 x per day and neb at least once per day.  Dyspnea:  Still doing ok on the treadmill with the 02 but more subj wheezing assoc with lots of nasal congestions x 2 weeks Cough: worse after supper min prod Sleep: ok  SABA use:  Overuse as above rec Pantoprazole (protonix) 40 mg   Take  30-60 min before first meal of the day and Pepcid (famotidine)  20 mg one after supper   until return to office - this is the best way to tell whether stomach acid is contributing to your problem.   Prednisone 10 mg take  4 each am x 2 days,   2 each am x 2 days,  1 each am x 2 days and stop  Plan A = Automatic = symbicort 160 2bid and flonase one puff one twice daily  Plan B = Backup Only use your albuterol as a rescue medication Plan C = Crisis - only use your albuterol nebulizer if you first try Plan B and it fails to help      07/18/2017  f/u ov/Bartosz Luginbill re: copd III/ improved sob  On symb 160 2bid  Chief Complaint  Patient presents with  . Follow-up    Breathing has improved to his normal baseline. He is still coughing some and has had minimal wheezing. He uses the albuterol inhaler 3 x per on average and has not needed neb.   Dyspnea:  Treadmill x 20 min s stopping 2-2.2 flat better when wears 02  Cough: mostly after supper  Sleep: ok SABA use:   3 x per week  rec Change to pepcid to where you take it after supper not at bedtime Go ahead and take the pm dose of symbiocrt at 11 or 12 hour mark    10/30/2017  f/u ov/[name] re:  Copd III/  maint on symb 160 2bid  Chief Complaint  Patient presents with  . Follow-up    PFT today, SOB with activity, productive cough (clear/yellow), headache, O2 as needed   Dyspnea:  Treadmill x 20 min on vs off 02  Cough: worse attributes to  sinus drainage / zyrtec helps some  Sleeping: ok on on side  SABA use: rarely 02: uses q o noct    No obvious day to day or daytime variability or assoc excess/ purulent sputum or mucus plugs or hemoptysis or cp or chest tightness, subjective wheeze or overt   hb symptoms.   Sleeping as above  without nocturnal  or early am exacerbation  of respiratory  c/o's or need for noct saba. Also denies any obvious fluctuation of symptoms with weather or environmental changes or other aggravating or alleviating factors except as outlined above   No unusual exposure hx or h/o childhood pna/ asthma or knowledge  of premature birth.  Current Allergies, Complete Past Medical History, Past Surgical History, Family History, and Social History were reviewed in [employer] record.  ROS  The following are not active complaints unless bolded Hoarseness, sore throat, dysphagia, dental problems, itching, sneezing,  nasal congestion or discharge of excess mucus or purulent secretions, ear ache,   fever, chills, sweats, unintended wt loss or wt gain, classically pleuritic or exertional cp,  orthopnea pnd or arm/hand swelling  or leg swelling, presyncope, palpitations, abdominal pain, anorexia, nausea, vomiting, diarrhea  or change in bowel habits or change in bladder habits, change in stools or change in urine, dysuria, hematuria,  rash, arthralgias, visual complaints, headache, numbness, weakness or ataxia or problems with walking or coordination,  change in mood or  memory.        Current Meds  Medication Sig  . albuterol (PROVENTIL HFA;VENTOLIN HFA) 108 (90 Base) MCG/ACT inhaler Inhale 2 puffs into the lungs every 6 (six) hours as needed for wheezing.  Marland Kitchen albuterol (PROVENTIL) (2.5 MG/3ML) 0.083% nebulizer solution Take 3 mLs (2.5 mg total) by nebulization every 6 (six) hours as needed for wheezing or shortness of breath.  Marland Kitchen aspirin 81 MG tablet Take 81 mg by mouth every other day.   . budesonide-formoterol (SYMBICORT) 160-4.5 MCG/ACT inhaler Inhale 2 puffs into the lungs 2 (two) times daily.  . famotidine (PEPCID) 20 MG tablet One at bedtime  . fluticasone (FLONASE) 50 MCG/ACT nasal spray Place 2 sprays into both nostrils daily as needed for allergies or rhinitis.  Marland Kitchen ibuprofen (ADVIL,MOTRIN) 200 MG tablet Take 600 mg by mouth every 6 (six) hours as needed (pain).  . Multiple Vitamin (MULTIVITAMIN) tablet Take 1 tablet by mouth daily.    . OXYGEN 2.5 lpm with sleep "as needed" [facility]  . pantoprazole (PROTONIX) 40 MG tablet Take 1 tablet (40 mg total) by mouth daily. Take 30-60 min before  first meal of the day  . Respiratory Therapy Supplies (FLUTTER) DEVI Use as directed.  . rosuvastatin (CRESTOR) 5 MG tablet TAKE 1 TABLET EVERY 3 DAYS                Objective:   Physical Exam   10/30/2017       146  07/18/2017  148   06/20/2017         149   05/02/17 151 lb (68.5 kg)  11/03/16 154 lb (69.9 kg)  04/18/16 154 lb (69.9 kg)    amb pleasant wm/ nasal tone to voice  Vital signs reviewed - Note on arrival 02 sats  93% on RA       HEENT: nl dentition / oropharynx. Nl external ear canals without cough reflex -  Mild bilateral non-specific turbinate edema     NECK :  without JVD/Nodes/TM/ nl carotid upstrokes bilaterally   LUNGS: no acc muscle use,  Mild barrel  contour chest wall with bilateral  Distant bs s audible wheeze and  without cough on insp or exp maneuver and mild  Hyperresonant  to  percussion bilaterally     CV:  RRR  no s3 or murmur or increase in P2, and no edema   ABD:  soft and nontender with pos mid/late insp Hoover's  in the supine position. No bruits or organomegaly appreciated, bowel sounds nl  MS:   Nl gait/  ext warm without deformities, calf tenderness, cyanosis or clubbing No obvious joint restrictions   SKIN: warm and dry without lesions    NEURO:  alert, approp, nl sensorium with  no motor or cerebellar deficits apparent.                 Assessment & Plan:

## 2017-10-30 NOTE — Progress Notes (Signed)
PFT completed today. 10/30/17  

## 2017-11-01 ENCOUNTER — Encounter: Payer: Self-pay | Admitting: Internal Medicine

## 2017-11-01 NOTE — Assessment & Plan Note (Signed)
05/02/2017  Walked RA x 3 laps @ 185 ft each stopped due to  End of study, nl pace, desats to 86% s sob   10/30/2017  02 sats fine at rest so wear 02 2lpm hs and prn with ex with goal of keeping sats > 90%   Pulmonary f/u is prn

## 2017-11-01 NOTE — Assessment & Plan Note (Signed)
Quit smoking 2008  08/31/2011  Spirometry FeV1 40%  FeV1/FVC 61%  04/21/15 Alpha-1 Antitrypsin: MM (161) - 10/12/15: FVC 3.14 L (70%) FEV1 1.43 L (43%) FEV1/FVC 0.45   negative bronchodilator response   DLCO corrected 37% - 05/02/2017  > try change symb to bevespi > HA > resolved back on symb 160    - PFT's  10/30/2017  FEV1 1.47 (45 % ) ratio 50  p 6 % improvement from saba p nothing prior to study with DLCO  30 % corrects to 40  % for alv volume    - The proper method of use, as well as anticipated side effects, of a metered-dose inhaler are discussed and demonstrated to the patient.   Did not do well on lama/laba but reasonable qol on laba/ics  And cough is better with empiric rx for gerd so no change rx needed and pulmonary f/u can be prn   I had an extended discussion with the patient reviewing all relevant studies completed to date and  lasting 15 to 20 minutes of a 25 minute visit    See device teaching which extended face to face time for this visit.  Each maintenance medication was reviewed in detail including emphasizing most importantly the difference between maintenance and prns and under what circumstances the prns are to be triggered using an action plan format that is not reflected in the computer generated alphabetically organized AVS which I have not found useful in most complex patients, especially with respiratory illnesses  Please see AVS for specific instructions unique to this visit that I personally wrote and verbalized to the the pt in detail and then reviewed with pt  by my nurse highlighting any  changes in therapy recommended at today's visit to their plan of care.

## 2017-11-01 NOTE — Assessment & Plan Note (Signed)
Allergy profile 06/20/2017 >  Eos 0.1 /  IgE  76  Rast Pos dust only  - sinus CT 06/26/2017 >  Normal paranasal sinuses.  Still has some pnds issues even with gerd rx and zyrtec > rec ent f/u prn locally > defer to pcp

## 2018-05-09 ENCOUNTER — Encounter: Payer: Self-pay | Admitting: Physician Assistant

## 2018-05-21 IMAGING — DX DG CHEST 2V
2 series · 2 of 2 positions shown · non-contrast
Comparison: Chest x-ray dated March 24, 2016.

CLINICAL DATA: Productive cough and shortness of breath for the
past 2 weeks.

EXAM:
CHEST - 2 VIEW

[chest pa]
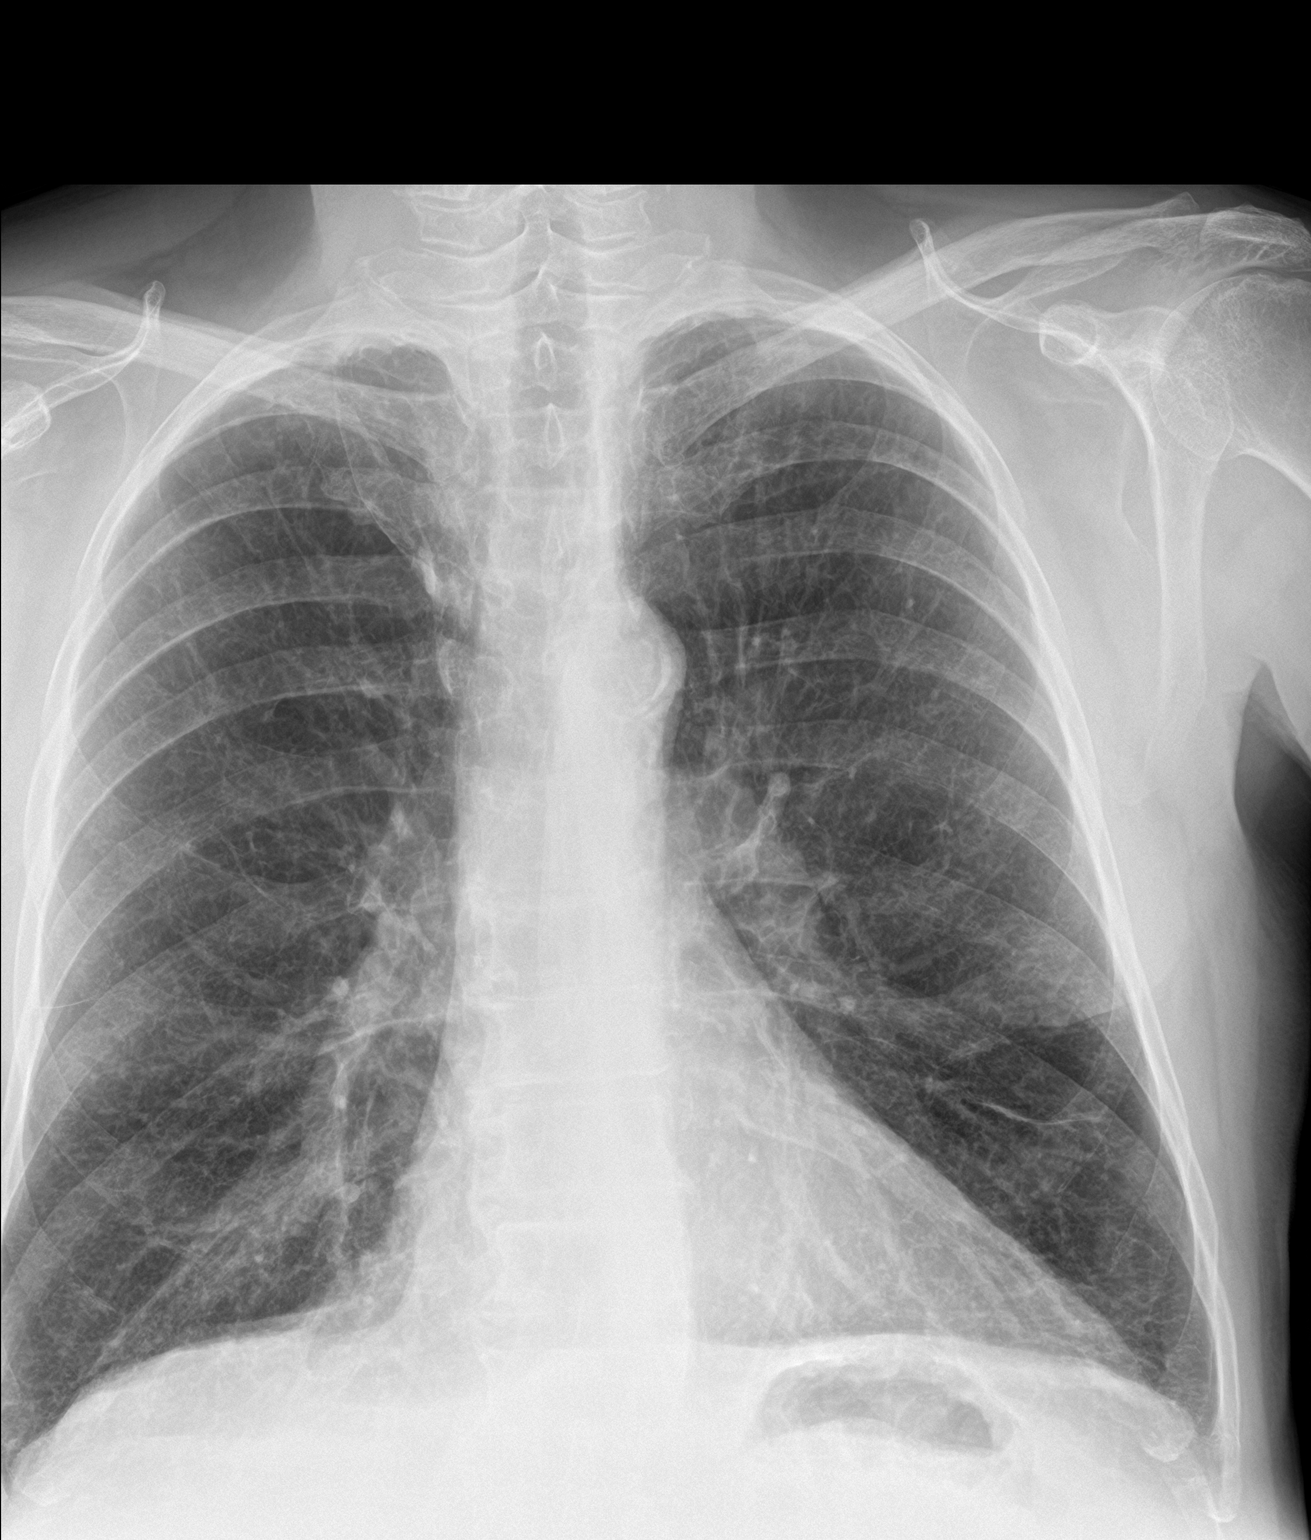

[chest lat]
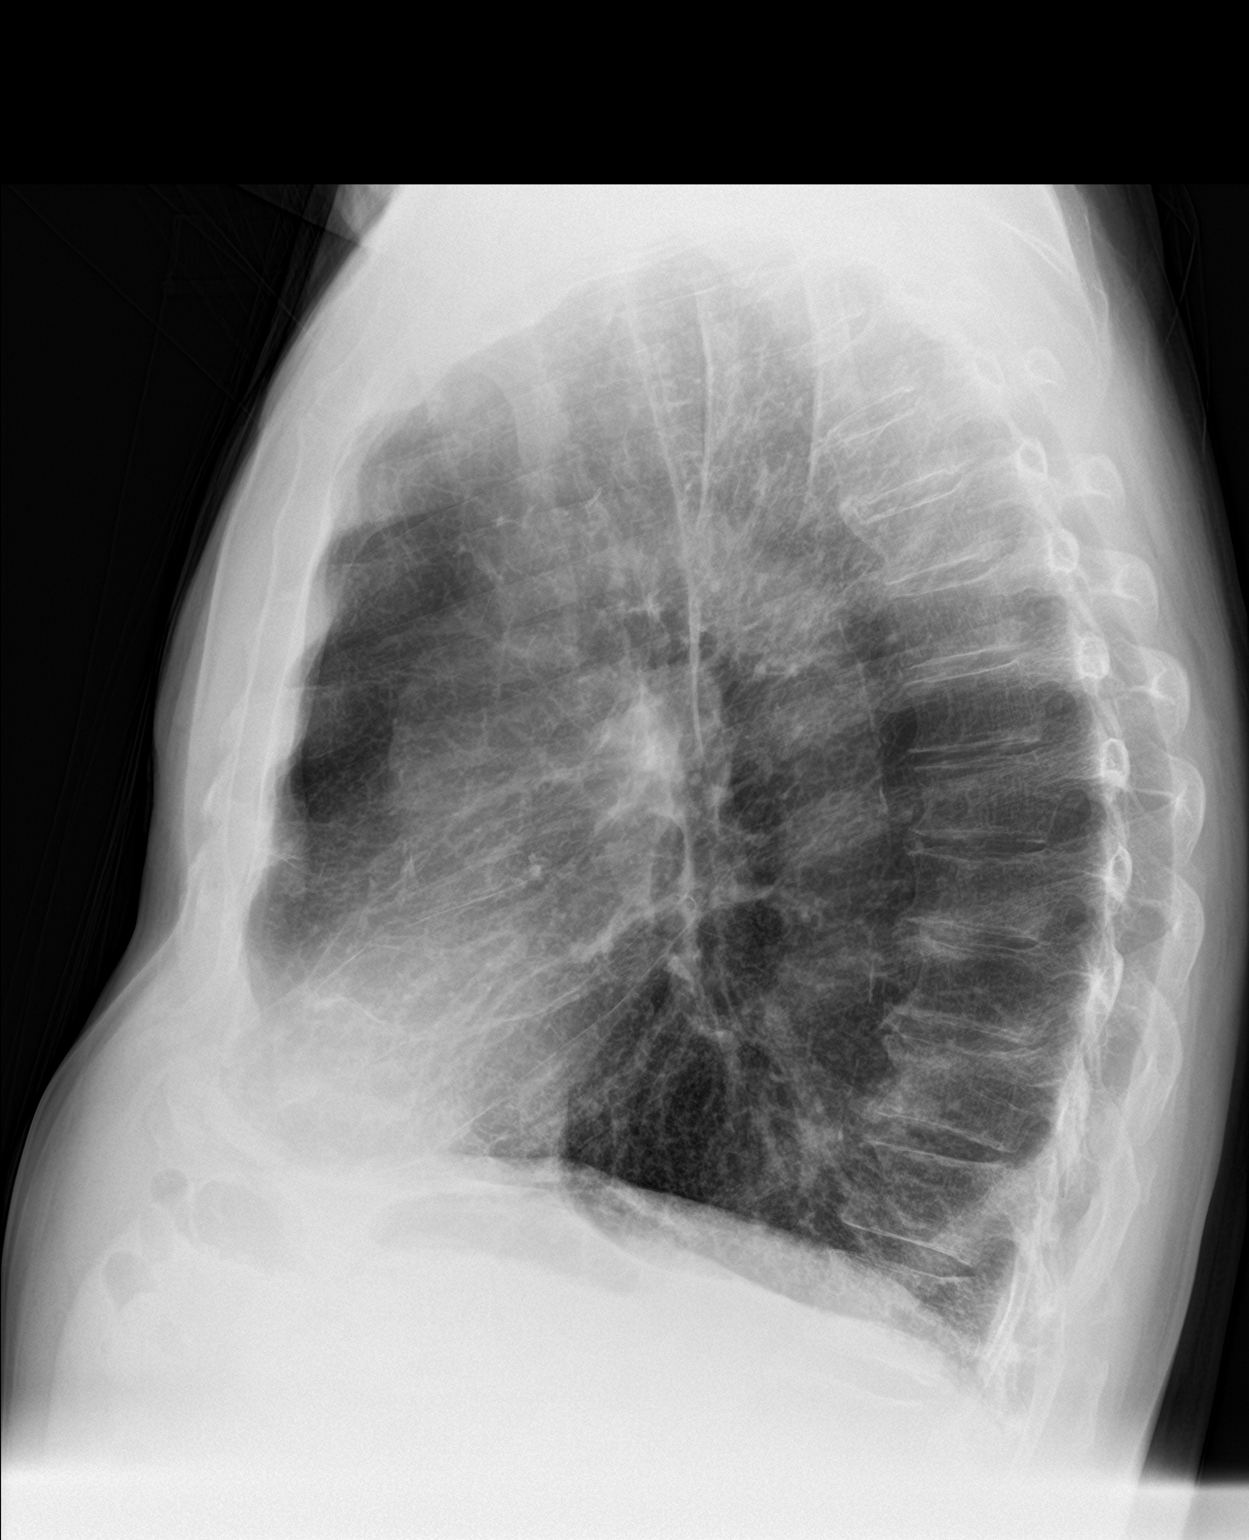

[2 of 2 positions shown; findings below may reference images not displayed]

FINDINGS: The heart size and mediastinal contours are within normal limits.
Normal pulmonary vascularity. Chronically coarsened interstitial
markings are similar to prior study and likely smoking-related.
Stable hyperinflation and emphysematous changes. No focal
consolidation, pleural effusion, or pneumothorax. No acute osseous
abnormality.
IMPRESSION: COPD.  No active cardiopulmonary disease.

## 2018-05-27 IMAGING — CT CT PARANASAL SINUSES LIMITED
1 of 2 series · 9 of 12 positions shown, 12 images · non-contrast
Comparison: None.

CLINICAL DATA: 67-year-old male with severe COPD and emphysema.
Persistent cough.

EXAM:
CT PARANASAL SINUS LIMITED WITHOUT CONTRAST
TECHNIQUE: Non-contiguous multidetector CT images of the paranasal sinuses were
obtained in a single plane without contrast.

[Series 4: limited sinus st · axial · 0.50mm/px · z∈[-94,-14]mm · 9 of 11 slices shown, 12 images]
[im 2/11  brain]
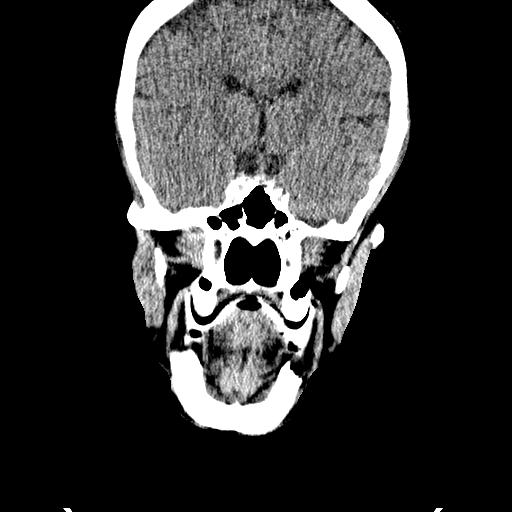
[im 2/11  bone]
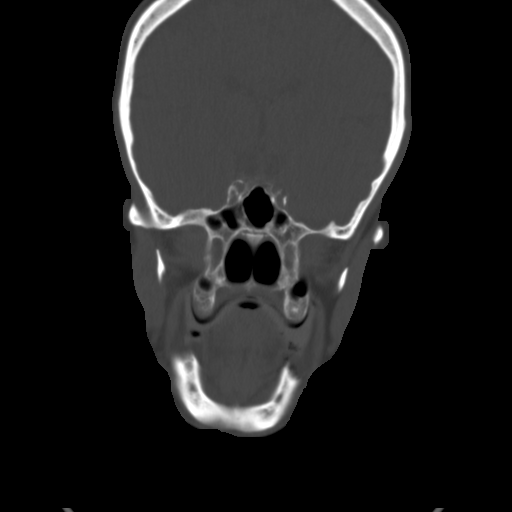
[im 3/11  bone]
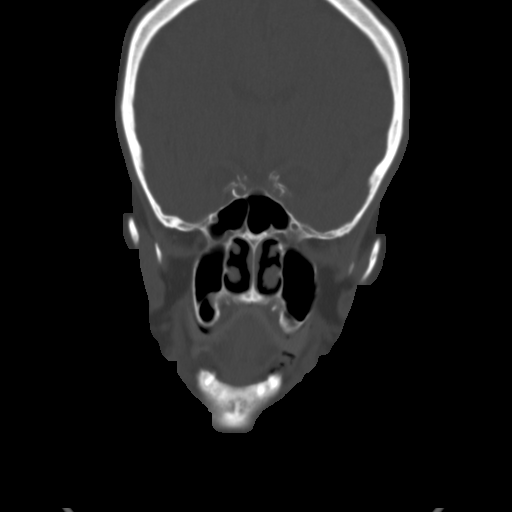
[im 4/11  bone]
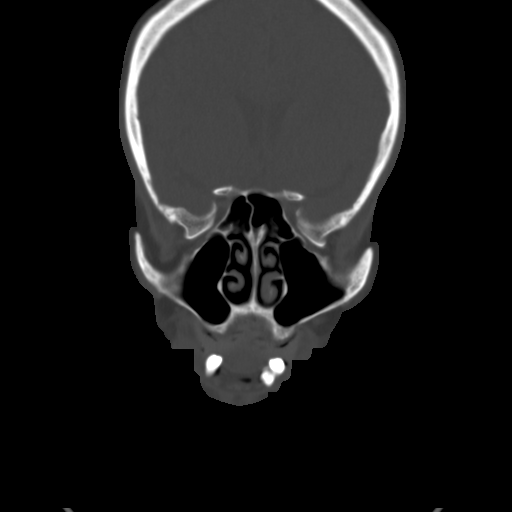
[im 5/11  bone]
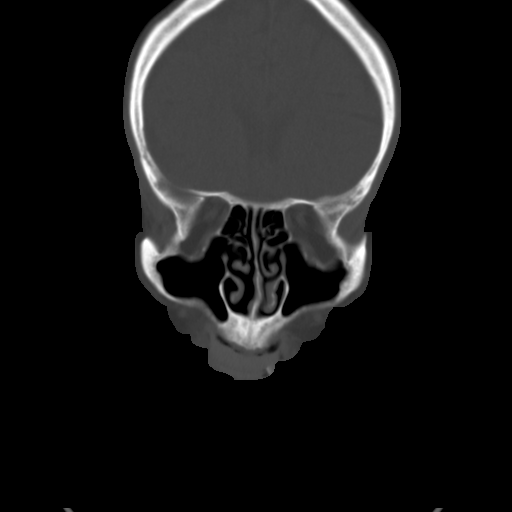
[im 6/11  brain]
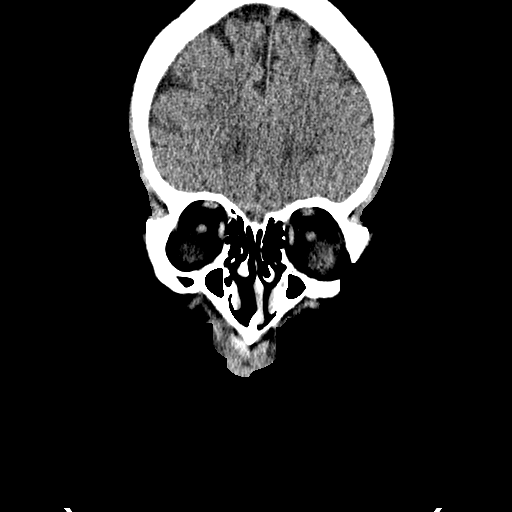
[im 6/11  bone]
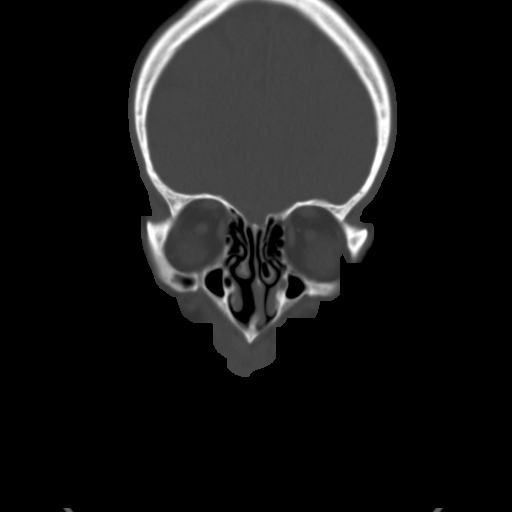
[im 7/11  bone]
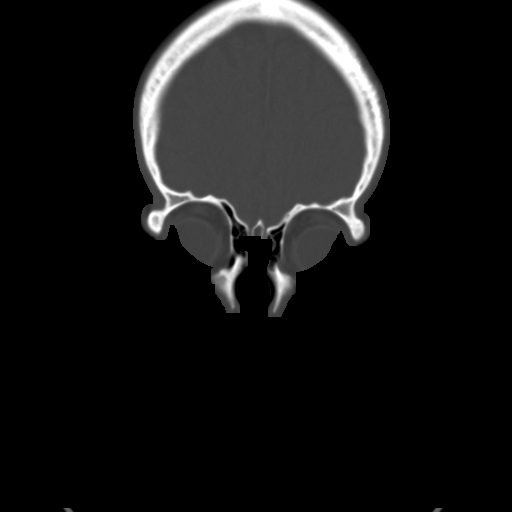
[im 8/11  bone]
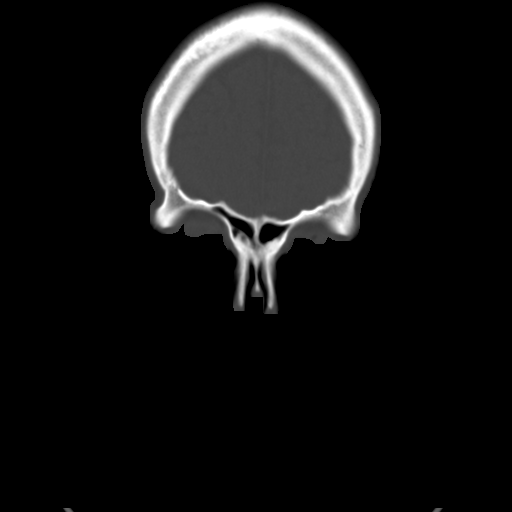
[im 9/11  bone]
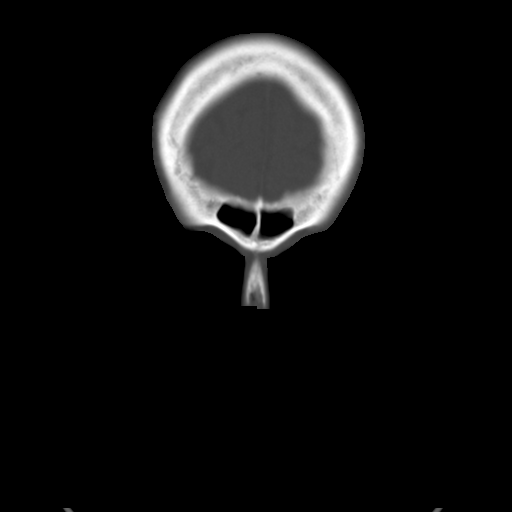
[im 10/11  brain]
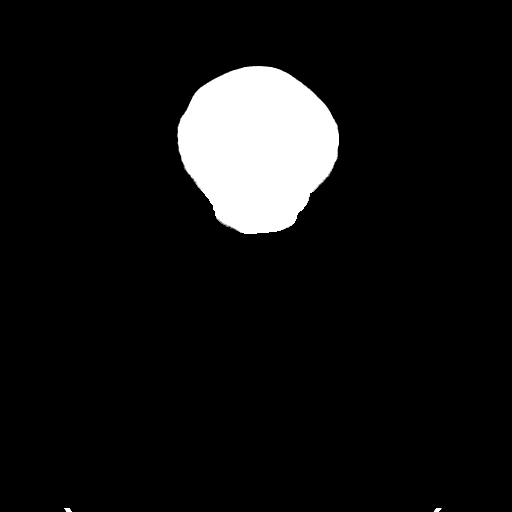
[im 10/11  bone]
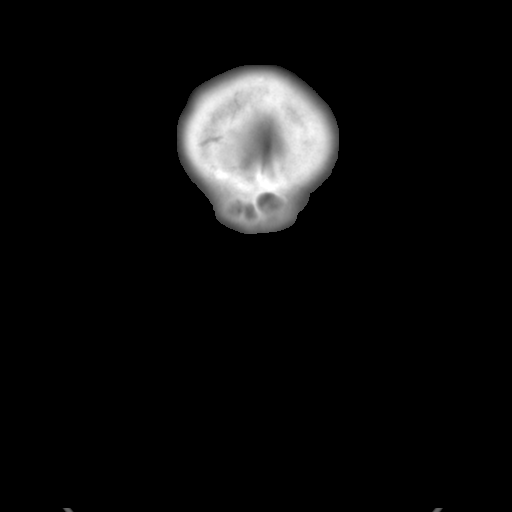

[9 of 12 positions shown; findings below may reference images not displayed]

FINDINGS: Negative visible noncontrast brain parenchyma, orbit and face soft
tissues. Calcified atherosclerosis at the skull base.

No acute osseous abnormality identified. Maxillary dentition appears
absent.

The visible bilateral paranasal sinuses are clear. The mastoid air
cells and sphenoid sinuses are somewhat hyperplastic. Unremarkable
visible nasal cavity aside from mild rightward septal deviation.
IMPRESSION: Normal paranasal sinuses.

## 2018-05-28 ENCOUNTER — Telehealth: Payer: Self-pay | Admitting: Physician Assistant

## 2018-05-28 NOTE — Progress Notes (Deleted)
Cardiology Office Note    Date:  05/28/2018   ID:  Russell Schaefer, DOB 14-Jan-1951, MRN [id number]  PCP:  Dan Maker, MD  Cardiologist: Verne Carrow, MD EPS: None  No chief complaint on file.   History of Present Illness:  Russell Schaefer is a 68 y.o. male with history of abnormal EKG and CTA with calcium score 2177 and nonobstructive disease followed by cardiac catheterization 12/25/2014 showing moderate nonobstructive CAD  but no focal obstructive lesions.  Also has LBBB, COPD/bronchitis, chronic dyspnea on exertion  Patient last saw Dr. Clifton Schaefer 05/24/2017 and was doing well without chest pain.   Past Medical History:  Diagnosis Date  . Chronic respiratory failure (HCC)   . COPD (chronic obstructive pulmonary disease) (HCC)   . Coronary artery disease 12/2014   a. nonobstructive CAD 12/2014.  . Emphysema (subcutaneous) (surgical) resulting from a procedure   . Emphysema of lung (HCC)   . History of home oxygen therapy   . History of weight change   . LBBB (left bundle branch block)   . Sexual dysfunction   . Sneezing     Past Surgical History:  Procedure Laterality Date  . APPENDECTOMY    . CARDIAC CATHETERIZATION N/A 12/25/2014   Procedure: Left Heart Cath and Coronary Angiography;  Surgeon: Kathleene Hazel, MD;  Location: [facility] INVASIVE CV LAB;  Service: Cardiovascular;  Laterality: N/A;    Current Medications: No outpatient medications have been marked as taking for the 05/29/18 encounter (Appointment) with Dyann Kief, PA-C.     Allergies:   Bevespi aerosphere [glycopyrrolate-formoterol]   Social History   Socioeconomic History  . Marital status: Married    Spouse name: Not on file  . Number of children: 1  . Years of education: Not on file  . Highest education level: Not on file  Occupational History  . Occupation: Garment/textile technologist  Social Needs  . Financial resource strain: Not on file  . Food insecurity:    Worry: Not  on file    Inability: Not on file  . Transportation needs:    Medical: Not on file    Non-medical: Not on file  Tobacco Use  . Smoking status: Former Smoker    Packs/day: 2.00    Years: 40.00    Pack years: 80.00    Types: Cigarettes    Last attempt to quit: 03/14/2006    Years since quitting: 12.2  . Smokeless tobacco: Never Used  Substance and Sexual Activity  . Alcohol use: No    Alcohol/week: 0.0 standard drinks  . Drug use: No  . Sexual activity: Not on file  Lifestyle  . Physical activity:    Days per week: Not on file    Minutes per session: Not on file  . Stress: Not on file  Relationships  . Social connections:    Talks on phone: Not on file    Gets together: Not on file    Attends religious service: Not on file    Active member of club or organization: Not on file    Attends meetings of clubs or organizations: Not on file    Relationship status: Not on file  Other Topics Concern  . Not on file  Social History Narrative   Originally from [place]. Always lived in [place]. Previously has traveled to [place], [place], [place], [place], [place], [place], [place]. No international travel. 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 No pets currently. No bird exposure. No mold exposure. No asbestos exposure.      Family History:  The patient's ***family history includes Emphysema in his father; Heart attack in his maternal grandfather; Heart disease in his mother; Lung cancer in his paternal uncle; Rheumatic fever in his mother.   ROS:   Please see the history of present illness.    ROS All other systems reviewed and are negative.   PHYSICAL EXAM:   VS:  There were no vitals taken for this visit.  Physical Exam  GEN: Well nourished, well developed, in no acute distress  HEENT: normal  Neck: no JVD, carotid bruits, or masses Cardiac:RRR; no murmurs, rubs, or gallops  Respiratory:  clear to auscultation bilaterally, normal  work of breathing GI: soft, nontender, nondistended, + BS Ext: without cyanosis, clubbing, or edema, Good distal pulses bilaterally MS: no deformity or atrophy  Skin: warm and dry, no rash Neuro:  Alert and Oriented x 3, Strength and sensation are intact Psych: euthymic mood, full affect  Wt Readings from Last 3 Encounters:  10/30/17 146 lb (66.2 kg)  07/18/17 148 lb 12.8 oz (67.5 kg)  06/20/17 149 lb 12.8 oz (67.9 kg)      Studies/Labs Reviewed:   EKG:  EKG is*** ordered today.  The ekg ordered today demonstrates ***  Recent Labs: 06/20/2017: Hemoglobin 15.0; Platelets 236.0   Lipid Panel    Component Value Date/Time   CHOL 133 06/04/2015 0800   TRIG 84 06/04/2015 0800   HDL 39 (L) 06/04/2015 0800   CHOLHDL 3.4 06/04/2015 0800   VLDL 17 06/04/2015 0800   LDLCALC 77 06/04/2015 0800    Additional studies/ records that were reviewed today include:    Cardiac cath 2016Prox RCA lesion, 40% stenosed.  Mid RCA to Dist RCA lesion, 40% stenosed.  RPDA lesion, 40% stenosed.  Mid Cx to Dist Cx lesion, 40% stenosed.  Prox LAD to Mid LAD lesion, 40% stenosed.  Mid LAD to Dist LAD lesion, 20% stenosed.  Ost 2nd Diag lesion, 60% stenosed.  Ost 1st Diag to 1st Diag lesion, 60% stenosed.  The left ventricular systolic function is normal.   1. Triple vessel CAD  2. Moderate non-obstructive disease in the RCA, LAD, small caliber diagonal branches and Circumflex artery.  3. Normal LV systolic function.    Recommendations: Medical management of CAD. Will continue ASA and start statin. Follow up with me in the office in 1-2 weeks.    AAA 2016IMPRESSION: 1) Extremely high coronary calcium score 98th percentile for age and sex matched controls. 2177 2) Percent stenosis hard to judge due to severe calcification of coronary arteries. Concern for greater than 50% stenosis In proximal/mid RCA, proximal/mid LAD and second diagonal branch. F/U with Dr Alice Reichert to discuss possible  catheterization or at the least perfusion study Charlton Haws Electronically Signed   By: Charlton Haws M.D.   On: 12/15/2014 15:58  2D echo 11/2014------------------------------------------------------------------- Study Conclusions   - Left ventricle: Abnormal septal motion from LBBB     Normal GLS -20. The cavity size was normal. Systolic function was   normal. The estimated ejection fraction was 55%. Wall motion was   normal; there were no regional wall motion abnormalities. - Left atrium: The atrium was mildly dilated. - Atrial septum: No defect or patent foramen ovale was identified.     ASSESSMENT:    1. Coronary artery disease involving native coronary artery of native heart without angina pectoris  2. LBBB (left bundle branch block)   3. COPD GOLD  III/ 02 dep at hs and with ex       PLAN:  In order of problems listed above:  CAD with elevated calcium score over 2000 and moderate nonobstructive disease on cardiac cath 2016  LBBB  COPD with history of tobacco abuse  Hyperlipidemia  Medication Adjustments/Labs and Tests Ordered: Current medicines are reviewed at length with the patient today.  Concerns regarding medicines are outlined above.  Medication changes, Labs and Tests ordered today are listed in the Patient Instructions below. There are no Patient Instructions on file for this visit.   Elson Clan, PA-C  05/28/2018 1:06 PM    [facility] [address] [place], [place], [place]  [address] Phone: [phone]; Fax: [phone]

## 2018-05-28 NOTE — Telephone Encounter (Signed)
Patient called to reschedule his yearly follow-up.  Patient is having no problems at the present time and is agreeable to reschedule.  Our office will be in contact with him.  He does not need any medication refills at this time.

## 2018-05-29 ENCOUNTER — Ambulatory Visit: Payer: Medicare HMO | Admitting: Physician Assistant

## 2018-06-01 NOTE — Telephone Encounter (Signed)
[place] already said low priority for 3 on this patient. Thanks

## 2018-06-08 ENCOUNTER — Other Ambulatory Visit: Payer: Self-pay | Admitting: Cardiovascular Disease

## 2018-07-25 ENCOUNTER — Other Ambulatory Visit: Payer: Self-pay | Admitting: Cardiovascular Disease

## 2018-09-29 ENCOUNTER — Other Ambulatory Visit: Payer: Self-pay | Admitting: Cardiovascular Disease

## 2018-11-03 ENCOUNTER — Other Ambulatory Visit: Payer: Self-pay | Admitting: Cardiovascular Disease

## 2019-05-07 ENCOUNTER — Telehealth: Payer: Self-pay | Admitting: Cardiovascular Disease

## 2019-05-07 NOTE — Telephone Encounter (Signed)
I spoke with patient's wife who reports patient gets things confused and has difficulty remembering instructions.  I told her I would make a note she could attend the visit with patient.

## 2019-05-07 NOTE — Telephone Encounter (Signed)
Patient's wife, Asher Muir, is requesting to come with her husband to his appt on 05/09/19 with [name]. She states that he has a hard time retaining information.

## 2019-05-09 ENCOUNTER — Encounter: Payer: Self-pay | Admitting: Cardiovascular Disease

## 2019-05-09 ENCOUNTER — Other Ambulatory Visit: Payer: Self-pay

## 2019-05-09 ENCOUNTER — Ambulatory Visit: Payer: Medicare HMO | Admitting: Cardiovascular Disease

## 2019-05-09 VITALS — BP 154/80 | HR 74 | Ht 69.0 in | Wt 126.6 lb

## 2019-05-09 DIAGNOSIS — I447 Left bundle-branch block, unspecified: Secondary | ICD-10-CM

## 2019-05-09 DIAGNOSIS — I10 Essential (primary) hypertension: Secondary | ICD-10-CM

## 2019-05-09 DIAGNOSIS — Z87891 Personal history of nicotine dependence: Secondary | ICD-10-CM | POA: Diagnosis not present

## 2019-05-09 DIAGNOSIS — I2510 Atherosclerotic heart disease of native coronary artery without angina pectoris: Secondary | ICD-10-CM | POA: Diagnosis not present

## 2019-05-09 MED ORDER — ROSUVASTATIN CALCIUM 5 MG PO TABS: ORAL_TABLET | ORAL | 3 refills | Status: DC

## 2019-05-09 NOTE — Progress Notes (Signed)
Chief Complaint  Patient presents with  . Follow-up    CAD   History of Present Illness: 69 yo male with history of former tobacco abuse, COPD/bronchitis, LBBB, moderate non-obstructive CAD who is here today for cardiac follow up. I met as a new patient in 2016. He was referred by Dr. Joya Gaskins in Pulmonary for further evaluation of dyspnea and abnormal EKG. He smoked 2 ppd for 40 years and quit in 2008. He c/o worsened dyspnea despite treatment for COPD. EKG in pulmonary office showed new LBBB. No chest pain. I arranged a coronary CTA which showed diffuse calcific CAD with moderate disease in the LAD and RCA. Echo overall normal. Cardiac cath 12/25/14 with moderate CAD but no focally obstructive lesions. He has done well with medical management of his CAD.   He is here today for follow up. The patient denies any chest pain, dyspnea, palpitations, lower extremity edema, orthopnea, PND, dizziness, near syncope or syncope. Norvasc started last week in primary care.   Primary Care Physician: Garth Bigness, NP  Past Medical History:  Diagnosis Date  . Chronic respiratory failure ([place])   . COPD (chronic obstructive pulmonary disease) ([place])   . Coronary artery disease 12/2014   a. nonobstructive CAD 12/2014.  . Emphysema (subcutaneous) (surgical) resulting from a procedure   . Emphysema of lung ([place])   . History of home oxygen therapy   . History of weight change   . LBBB (left bundle branch block)   . Sexual dysfunction   . Sneezing     Past Surgical History:  Procedure Laterality Date  . APPENDECTOMY    . CARDIAC CATHETERIZATION N/A 12/25/2014   Procedure: Left Heart Cath and Coronary Angiography;  Surgeon: Burnell Blanks, MD;  Location: [facility];  Service: Cardiovascular;  Laterality: N/A;    Current Outpatient Medications  Medication Sig Dispense Refill  . albuterol (PROVENTIL HFA;VENTOLIN HFA) 108 (90 Base) MCG/ACT inhaler Inhale 2 puffs into the  lungs every 6 (six) hours as needed for wheezing. 18 g 3  . albuterol (PROVENTIL) (2.5 MG/3ML) 0.083% nebulizer solution Take 3 mLs (2.5 mg total) by nebulization every 6 (six) hours as needed for wheezing or shortness of breath. 120 vial 3  . amLODipine (NORVASC) 5 MG tablet Take 1 tablet by mouth daily.    Marland Kitchen aspirin 81 MG tablet Take 81 mg by mouth every other day.     . budesonide-formoterol (SYMBICORT) 160-4.5 MCG/ACT inhaler Inhale 2 puffs into the lungs 2 (two) times daily. 1 Inhaler 0  . famotidine (PEPCID) 20 MG tablet One at bedtime 30 tablet 11  . fluticasone (FLONASE) 50 MCG/ACT nasal spray Place 2 sprays into both nostrils daily as needed for allergies or rhinitis.    Marland Kitchen ibuprofen (ADVIL,MOTRIN) 200 MG tablet Take 600 mg by mouth every 6 (six) hours as needed (pain).    . Multiple Vitamin (MULTIVITAMIN) tablet Take 1 tablet by mouth daily.      . OXYGEN 2.5 lpm with sleep "as needed" [facility]    . pantoprazole (PROTONIX) 40 MG tablet Take 1 tablet (40 mg total) by mouth daily. Take 30-60 min before first meal of the day 30 tablet 2  . Respiratory Therapy Supplies (FLUTTER) DEVI Use as directed. 1 each 0  . rosuvastatin (CRESTOR) 5 MG tablet TAKE 1 TABLET EVERY 3 DAYS. 36 tablet 3   No current facility-administered medications for this visit.    Allergies  Allergen Reactions  . Bevespi Aerosphere [Glycopyrrolate-Formoterol]  Headache and dizziness    Social History   Socioeconomic History  . Marital status: Married    Spouse name: Not on file  . Number of children: 1  . Years of education: Not on file  . Highest education level: Not on file  Occupational History  . Occupation: Associate Professor  Tobacco Use  . Smoking status: Former Smoker    Packs/day: 2.00    Years: 40.00    Pack years: 80.00    Types: Cigarettes    Quit date: 03/14/2006    Years since quitting: 13.1  . Smokeless tobacco: Never Used  Substance and Sexual Activity  . Alcohol use: No     Alcohol/week: 0.0 standard drinks  . Drug use: No  . Sexual activity: Not on file  Other Topics Concern  . Not on file  Social History Narrative   Originally from [place]. Always lived in [place]. Previously has traveled to [place], [place], [place], [place], [place], [place], [place]. No international travel. 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 No pets currently. No bird exposure. No mold exposure. No asbestos exposure.    Social Determinants of Health   Financial Resource Strain:   . Difficulty of Paying Living Expenses: Not on file  Food Insecurity:   . Worried About Charity fundraiser in the Last Year: Not on file  . Ran Out of Food in the Last Year: Not on file  Transportation Needs:   . Lack of Transportation (Medical): Not on file  . Lack of Transportation (Non-Medical): Not on file  Physical Activity:   . Days of Exercise per Week: Not on file  . Minutes of Exercise per Session: Not on file  Stress:   . Feeling of Stress : Not on file  Social Connections:   . Frequency of Communication with Friends and Family: Not on file  . Frequency of Social Gatherings with Friends and Family: Not on file  . Attends Religious Services: Not on file  . Active Member of Clubs or Organizations: Not on file  . Attends Archivist Meetings: Not on file  . Marital Status: Not on file  Intimate Partner Violence:   . Fear of Current or Ex-Partner: Not on file  . Emotionally Abused: Not on file  . Physically Abused: Not on file  . Sexually Abused: Not on file    Family History  Problem Relation Age of Onset  . Heart disease Mother        rheumatic fever  . Rheumatic fever Mother   . Emphysema Father   . Heart attack Maternal Grandfather   . Lung cancer Paternal Uncle     Review of Systems:  As stated in the HPI and otherwise negative.   BP (!) 154/80   Pulse 74   Ht 5' 9"  (1.753 m)   Wt 126 lb 9.6 oz (57.4 kg)   SpO2 94%    BMI 18.70 kg/m   Physical Examination:  General: Well developed, well nourished, NAD  HEENT: OP clear, mucus membranes moist  SKIN: warm, dry. No rashes. Neuro: No focal deficits  Musculoskeletal: Muscle strength 5/5 all ext  Psychiatric: Mood and affect normal  Neck: No JVD, no carotid bruits, no thyromegaly, no lymphadenopathy.  Lungs:Clear bilaterally, no wheezes, rhonci, crackles Cardiovascular: Regular rate and rhythm. No murmurs, gallops or rubs. Abdomen:Soft. Bowel sounds present. Non-tender.  Extremities: No lower extremity edema.  Pulses are 2 + in the bilateral DP/PT.  Cardiac cath 12/25/14:  Prox RCA lesion, 40% stenosed.  Mid RCA to Dist RCA lesion, 40% stenosed.  RPDA lesion, 40% stenosed.  Mid Cx to Dist Cx lesion, 40% stenosed.  Prox LAD to Mid LAD lesion, 40% stenosed.  Mid LAD to Dist LAD lesion, 20% stenosed.  Ost 2nd Diag lesion, 60% stenosed.  Ost 1st Diag to 1st Diag lesion, 60% stenosed.  The left ventricular systolic function is normal.  1. Triple vessel CAD  2. Moderate non-obstructive disease in the RCA, LAD, small caliber diagonal branches and Circumflex artery.  3. Normal LV systolic function.   Echo 12/12/14: Left ventricle: Abnormal septal motion from LBBB Normal GLS -20. The cavity size was normal. Systolic function was normal. The estimated ejection fraction was 55%. Wall motion was normal; there were no regional wall motion abnormalities. - Left atrium: The atrium was mildly dilated. - Atrial septum: No defect or patent foramen ovale was identified.  EKG:  EKG is ordered today. The ekg ordered today demonstrates NSR, LBBB  Recent Labs: No results found for requested labs within last 8760 hours.   Lipid Panel    Component Value Date/Time   CHOL 133 06/04/2015 0800   TRIG 84 06/04/2015 0800   HDL 39 (L) 06/04/2015 0800   CHOLHDL 3.4 06/04/2015 0800   VLDL 17 06/04/2015 0800   LDLCALC 77 06/04/2015 0800     Wt  Readings from Last 3 Encounters:  05/09/19 126 lb 9.6 oz (57.4 kg)  10/30/17 146 lb (66.2 kg)  07/18/17 148 lb 12.8 oz (67.5 kg)     Other studies Reviewed: Additional studies/ records that were reviewed today include: . Review of the above records demonstrates:    Assessment and Plan:   1. CAD without angina: He has no chest pain. He is known to have moderate CAD by cath in October 2016. Continue ASA and statin. Lipids are followed in primary care.  Will arrange an echo now to assess LV systolic function.   2. LBBB: Chronic.   3. Tobacco abuse, in remission: He stopped smoking in 2008. He has not restarted smoking.   4. HTN: BP is elevated. He has been started on Norvasc last week in primary care. He will follow BP at home and let primary care know if it remains elevated.   Current medicines are reviewed at length with the patient today.  The patient does not have concerns regarding medicines.  The following changes have been made:  no change  Labs/ tests ordered today include:   Orders Placed This Encounter  Procedures  . EKG 12-Lead  . ECHOCARDIOGRAM COMPLETE    Disposition:   FU with me in 12 months   Signed, Lauree Chandler, MD 05/09/2019 12:30 PM    [facility] [address], [place], [place]  [address] Phone: [phone]; Fax: [phone]

## 2019-05-09 NOTE — Patient Instructions (Signed)
Medication Instructions:  No changes *If you need a refill on your cardiac medications before your next appointment, please call your pharmacy*   Lab Work: none If you have labs (blood work) drawn today and your tests are completely normal, you will receive your results only by: . MyChart Message (if you have MyChart) OR . A paper copy in the mail If you have any lab test that is abnormal or we need to change your treatment, we will call you to review the results.   Testing/Procedures: Your physician has requested that you have an echocardiogram. Echocardiography is a painless test that uses sound waves to create images of your heart. It provides your doctor with information about the size and shape of your heart and how well your heart's chambers and valves are working. This procedure takes approximately one hour. There are no restrictions for this procedure.   Follow-Up: At [facility], you and your health needs are our priority.  As part of our continuing mission to provide you with exceptional heart care, we have created designated Provider Care Teams.  These Care Teams include your primary Cardiologist (physician) and Advanced Practice Providers (APPs -  Physician Assistants and Nurse Practitioners) who all work together to provide you with the care you need, when you need it.   Your next appointment:   12 month(s)  The format for your next appointment:   In Person  Provider:   You may see Christopher McAlhany, MD or one of the following Advanced Practice Providers on your designated Care Team:    Dayna Dunn, PA-C  Michele Lenze, PA-C   Other Instructions   

## 2019-05-28 ENCOUNTER — Ambulatory Visit (HOSPITAL_COMMUNITY): Payer: Medicare HMO | Attending: Cardiology

## 2019-05-28 ENCOUNTER — Other Ambulatory Visit: Payer: Self-pay

## 2019-05-28 DIAGNOSIS — Z87891 Personal history of nicotine dependence: Secondary | ICD-10-CM | POA: Diagnosis present

## 2019-05-28 DIAGNOSIS — I10 Essential (primary) hypertension: Secondary | ICD-10-CM

## 2019-05-28 DIAGNOSIS — I447 Left bundle-branch block, unspecified: Secondary | ICD-10-CM | POA: Diagnosis present

## 2019-05-28 DIAGNOSIS — I2510 Atherosclerotic heart disease of native coronary artery without angina pectoris: Secondary | ICD-10-CM | POA: Diagnosis present

## 2019-06-06 ENCOUNTER — Telehealth: Payer: Self-pay | Admitting: Cardiovascular Disease

## 2019-06-06 DIAGNOSIS — R931 Abnormal findings on diagnostic imaging of heart and coronary circulation: Secondary | ICD-10-CM

## 2019-06-06 NOTE — Telephone Encounter (Signed)
  Wife is returning call regarding patients echo results.

## 2019-06-06 NOTE — Telephone Encounter (Signed)
Russell Hazel, MD  Lendon Ka, RN  His heart strength is slightly weaker than before. Cardiac cath in 2016 with moderate non-obstructive CAD. He will need an exercise nuclear stress test to exclude ischemia as the cause of his heart being weaker. Can we let him know and arrange the stress test? Thanks, chris        Order placed for exercise myoview. Call to patient's wife and informed of review and recommendations.  She is aware that they will be contacted to schedule covid screening and exercise stress test. No questions.

## 2019-06-18 ENCOUNTER — Other Ambulatory Visit: Payer: Self-pay

## 2019-06-18 MED ORDER — ROSUVASTATIN CALCIUM 5 MG PO TABS: ORAL_TABLET | ORAL | 2 refills | Status: DC

## 2019-06-27 ENCOUNTER — Telehealth (HOSPITAL_COMMUNITY): Payer: Self-pay

## 2019-06-27 NOTE — Telephone Encounter (Signed)
Encounter complete. 

## 2019-06-28 ENCOUNTER — Telehealth (HOSPITAL_COMMUNITY): Payer: Self-pay

## 2019-06-28 ENCOUNTER — Other Ambulatory Visit (HOSPITAL_COMMUNITY)
Admission: RE | Admit: 2019-06-28 | Discharge: 2019-06-28 | Disposition: A | Discharge status: Home / Self Care | Payer: Medicare HMO | Source: Ambulatory Visit | Attending: Cardiovascular Disease | Admitting: Cardiovascular Disease

## 2019-06-28 DIAGNOSIS — Z20822 Contact with and (suspected) exposure to covid-19: Secondary | ICD-10-CM | POA: Diagnosis not present

## 2019-06-28 DIAGNOSIS — Z01812 Encounter for preprocedural laboratory examination: Secondary | ICD-10-CM | POA: Insufficient documentation

## 2019-06-28 LAB — SARS CORONAVIRUS 2 (TAT 6-24 HRS): SARS Coronavirus 2: NEGATIVE

## 2019-06-28 NOTE — Telephone Encounter (Signed)
Encounter complete. 

## 2019-07-02 ENCOUNTER — Other Ambulatory Visit: Payer: Self-pay

## 2019-07-02 ENCOUNTER — Ambulatory Visit (HOSPITAL_COMMUNITY)
Admission: RE | Admit: 2019-07-02 | Discharge: 2019-07-02 | Disposition: A | Discharge status: Home / Self Care | Payer: Medicare HMO | Source: Ambulatory Visit | Attending: Cardiovascular Disease | Admitting: Cardiovascular Disease

## 2019-07-02 DIAGNOSIS — R931 Abnormal findings on diagnostic imaging of heart and coronary circulation: Secondary | ICD-10-CM | POA: Insufficient documentation

## 2019-07-02 DIAGNOSIS — I2510 Atherosclerotic heart disease of native coronary artery without angina pectoris: Secondary | ICD-10-CM | POA: Insufficient documentation

## 2019-07-02 DIAGNOSIS — I10 Essential (primary) hypertension: Secondary | ICD-10-CM | POA: Insufficient documentation

## 2019-07-02 DIAGNOSIS — I447 Left bundle-branch block, unspecified: Secondary | ICD-10-CM | POA: Insufficient documentation

## 2019-07-02 DIAGNOSIS — Z8249 Family history of ischemic heart disease and other diseases of the circulatory system: Secondary | ICD-10-CM | POA: Insufficient documentation

## 2019-07-02 DIAGNOSIS — Z87891 Personal history of nicotine dependence: Secondary | ICD-10-CM | POA: Insufficient documentation

## 2019-07-02 DIAGNOSIS — R0609 Other forms of dyspnea: Secondary | ICD-10-CM | POA: Diagnosis not present

## 2019-07-02 DIAGNOSIS — K219 Gastro-esophageal reflux disease without esophagitis: Secondary | ICD-10-CM | POA: Diagnosis not present

## 2019-07-02 DIAGNOSIS — J439 Emphysema, unspecified: Secondary | ICD-10-CM | POA: Insufficient documentation

## 2019-07-02 LAB — MYOCARDIAL PERFUSION IMAGING
LV dias vol: 164 mL (ref 62–150)
LV sys vol: 107 mL
Peak HR: 83 {beats}/min
Rest HR: 68 {beats}/min
SDS: 2
SRS: 6
SSS: 8
TID: 0.99

## 2019-07-02 MED ORDER — TECHNETIUM TC 99M TETROFOSMIN IV KIT
9.6000 | PACK | Freq: Once | INTRAVENOUS | Status: AC | PRN
  Administered 2019-07-02: 9.6 via INTRAVENOUS
  Filled 2019-07-02: qty 10

## 2019-07-02 MED ORDER — AMINOPHYLLINE 25 MG/ML IV SOLN
75.0000 mg | Freq: Once | INTRAVENOUS | Status: AC
  Administered 2019-07-02: 75 mg via INTRAVENOUS

## 2019-07-02 MED ORDER — TECHNETIUM TC 99M TETROFOSMIN IV KIT
31.4000 | PACK | Freq: Once | INTRAVENOUS | Status: AC | PRN
  Administered 2019-07-02: 31.4 via INTRAVENOUS
  Filled 2019-07-02: qty 32

## 2019-07-02 MED ORDER — REGADENOSON 0.4 MG/5ML IV SOLN
0.4000 mg | Freq: Once | INTRAVENOUS | Status: AC
  Administered 2019-07-02: 0.4 mg via INTRAVENOUS

## 2019-07-04 ENCOUNTER — Ambulatory Visit: Payer: Medicare HMO | Admitting: Cardiovascular Disease

## 2019-07-04 ENCOUNTER — Other Ambulatory Visit: Payer: Self-pay

## 2019-07-04 ENCOUNTER — Encounter: Payer: Self-pay | Admitting: Cardiovascular Disease

## 2019-07-04 VITALS — BP 150/70 | HR 70 | Ht 69.0 in | Wt 125.6 lb

## 2019-07-04 DIAGNOSIS — I447 Left bundle-branch block, unspecified: Secondary | ICD-10-CM | POA: Diagnosis not present

## 2019-07-04 DIAGNOSIS — I420 Dilated cardiomyopathy: Secondary | ICD-10-CM | POA: Diagnosis not present

## 2019-07-04 DIAGNOSIS — I2510 Atherosclerotic heart disease of native coronary artery without angina pectoris: Secondary | ICD-10-CM | POA: Diagnosis not present

## 2019-07-04 DIAGNOSIS — I10 Essential (primary) hypertension: Secondary | ICD-10-CM

## 2019-07-04 MED ORDER — LISINOPRIL 5 MG PO TABS: 5.0000 mg | ORAL_TABLET | Freq: Every day | ORAL | 3 refills | Status: DC

## 2019-07-04 MED ORDER — CARVEDILOL 3.125 MG PO TABS: 3.1250 mg | ORAL_TABLET | Freq: Two times a day (BID) | ORAL | 3 refills | Status: DC

## 2019-07-04 NOTE — Progress Notes (Signed)
Chief Complaint  Patient presents with  . Follow-up    CAD   History of Present Illness: 69 yo male with history of former tobacco abuse, COPD/bronchitis, LBBB, moderate non-obstructive CAD who is here today for cardiac follow up. I met as a new patient in 2016. He was referred by Dr. Joya Gaskins in Pulmonary for further evaluation of dyspnea and abnormal EKG. He smoked 2 ppd for 40 years and quit in 2008. He c/o worsened dyspnea despite treatment for COPD. EKG in pulmonary office showed new LBBB. No chest pain. I arranged a coronary CTA which showed diffuse calcific CAD with moderate disease in the LAD and RCA. Echo overall normal. Cardiac cath 12/25/14 with moderate CAD but no focally obstructive lesions. He has done well with medical management of his CAD. Echo 05/28/19 with LVEF=40-45% with no significant valve disease. Nuclear stress test 07/02/19 with poor perfusion which may be due to prior MI. No large areas of ischemia but considered a high risk study due to LVEF=34%.   He is here today for follow up. The patient denies any chest pain, dyspnea, palpitations, lower extremity edema, orthopnea, PND, dizziness, near syncope or syncope.   Primary Care Physician: Garth Bigness, NP  Past Medical History:  Diagnosis Date  . Chronic respiratory failure ([place])   . COPD (chronic obstructive pulmonary disease) ([place])   . Coronary artery disease 12/2014   a. nonobstructive CAD 12/2014.  . Emphysema (subcutaneous) (surgical) resulting from a procedure   . Emphysema of lung ([place])   . History of home oxygen therapy   . History of weight change   . LBBB (left bundle branch block)   . Sexual dysfunction   . Sneezing     Past Surgical History:  Procedure Laterality Date  . APPENDECTOMY    . CARDIAC CATHETERIZATION N/A 12/25/2014   Procedure: Left Heart Cath and Coronary Angiography;  Surgeon: Burnell Blanks, MD;  Location: [facility];  Service: Cardiovascular;  Laterality:  N/A;    Current Outpatient Medications  Medication Sig Dispense Refill  . albuterol (PROVENTIL HFA;VENTOLIN HFA) 108 (90 Base) MCG/ACT inhaler Inhale 2 puffs into the lungs every 6 (six) hours as needed for wheezing. 18 g 3  . albuterol (PROVENTIL) (2.5 MG/3ML) 0.083% nebulizer solution Take 3 mLs (2.5 mg total) by nebulization every 6 (six) hours as needed for wheezing or shortness of breath. 120 vial 3  . aspirin 81 MG tablet Take 81 mg by mouth every other day.     . budesonide-formoterol (SYMBICORT) 160-4.5 MCG/ACT inhaler Inhale 2 puffs into the lungs 2 (two) times daily. 1 Inhaler 0  . famotidine (PEPCID) 20 MG tablet One at bedtime 30 tablet 11  . fluticasone (FLONASE) 50 MCG/ACT nasal spray Place 2 sprays into both nostrils daily as needed for allergies or rhinitis.    Marland Kitchen ibuprofen (ADVIL,MOTRIN) 200 MG tablet Take 600 mg by mouth every 6 (six) hours as needed (pain).    . Multiple Vitamin (MULTIVITAMIN) tablet Take 1 tablet by mouth daily.      . OXYGEN 2.5 lpm with sleep "as needed" [facility]    . pantoprazole (PROTONIX) 40 MG tablet Take 1 tablet (40 mg total) by mouth daily. Take 30-60 min before first meal of the day 30 tablet 2  . Respiratory Therapy Supplies (FLUTTER) DEVI Use as directed. 1 each 0  . rosuvastatin (CRESTOR) 5 MG tablet TAKE 1 TABLETBY MOUTH EVERY 3 DAYS. 36 tablet 2  . carvedilol (COREG) 3.125  MG tablet Take 1 tablet (3.125 mg total) by mouth 2 (two) times daily. 180 tablet 3  . lisinopril (ZESTRIL) 5 MG tablet Take 1 tablet (5 mg total) by mouth daily. 90 tablet 3   No current facility-administered medications for this visit.    Allergies  Allergen Reactions  . Bevespi Aerosphere [Glycopyrrolate-Formoterol]     Headache and dizziness    Social History   Socioeconomic History  . Marital status: Married    Spouse name: Not on file  . Number of children: 1  . Years of education: Not on file  . Highest education level: Not on file  Occupational  History  . Occupation: Associate Professor  Tobacco Use  . Smoking status: Former Smoker    Packs/day: 2.00    Years: 40.00    Pack years: 80.00    Types: Cigarettes    Quit date: 03/14/2006    Years since quitting: 13.3  . Smokeless tobacco: Never Used  Substance and Sexual Activity  . Alcohol use: No    Alcohol/week: 0.0 standard drinks  . Drug use: No  . Sexual activity: Not on file  Other Topics Concern  . Not on file  Social History Narrative   Originally from [place]. Always lived in [place]. Previously has traveled to [place], [place], [place], [place], [place], [place], [place]. No international travel. 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 No pets currently. No bird exposure. No mold exposure. No asbestos exposure.    Social Determinants of Health   Financial Resource Strain:   . Difficulty of Paying Living Expenses:   Food Insecurity:   . Worried About Charity fundraiser in the Last Year:   . Arboriculturist in the Last Year:   Transportation Needs:   . Film/video editor (Medical):   Marland Kitchen Lack of Transportation (Non-Medical):   Physical Activity:   . Days of Exercise per Week:   . Minutes of Exercise per Session:   Stress:   . Feeling of Stress :   Social Connections:   . Frequency of Communication with Friends and Family:   . Frequency of Social Gatherings with Friends and Family:   . Attends Religious Services:   . Active Member of Clubs or Organizations:   . Attends Archivist Meetings:   Marland Kitchen Marital Status:   Intimate Partner Violence:   . Fear of Current or Ex-Partner:   . Emotionally Abused:   Marland Kitchen Physically Abused:   . Sexually Abused:     Family History  Problem Relation Age of Onset  . Heart disease Mother        rheumatic fever  . Rheumatic fever Mother   . Emphysema Father   . Heart attack Maternal Grandfather   . Lung cancer Paternal Uncle     Review of Systems:  As stated in the  HPI and otherwise negative.   BP (!) 150/70   Pulse 70   Ht 5' 9" (1.753 m)   Wt 125 lb 9.6 oz (57 kg)   SpO2 94%   BMI 18.55 kg/m   Physical Examination:  General: Well developed, well nourished, NAD  HEENT: OP clear, mucus membranes moist  SKIN: warm, dry. No rashes. Neuro: No focal deficits  Musculoskeletal: Muscle strength 5/5 all ext  Psychiatric: Mood and affect normal  Neck: No JVD, no carotid bruits, no thyromegaly, no lymphadenopathy.  Lungs:Clear bilaterally,  no wheezes, rhonci, crackles Cardiovascular: Regular rate and rhythm. No murmurs, gallops or rubs. Abdomen:Soft. Bowel sounds present. Non-tender.  Extremities: No lower extremity edema. Pulses are 2 + in the bilateral DP/PT.  Cardiac cath 12/25/14:  Prox RCA lesion, 40% stenosed.  Mid RCA to Dist RCA lesion, 40% stenosed.  RPDA lesion, 40% stenosed.  Mid Cx to Dist Cx lesion, 40% stenosed.  Prox LAD to Mid LAD lesion, 40% stenosed.  Mid LAD to Dist LAD lesion, 20% stenosed.  Ost 2nd Diag lesion, 60% stenosed.  Ost 1st Diag to 1st Diag lesion, 60% stenosed.  The left ventricular systolic function is normal.  1. Triple vessel CAD  2. Moderate non-obstructive disease in the RCA, LAD, small caliber diagonal branches and Circumflex artery. 3. Normal LV systolic function.   Echo March 2021: 1. Left ventricular ejection fraction, by estimation, is 40 to 45%. The  left ventricle has mildly decreased function. The left ventricle  demonstrates global hypokinesis. Left ventricular diastolic parameters are  consistent with Grade I diastolic  dysfunction (impaired relaxation).  2. Right ventricular systolic function is normal. The right ventricular  size is normal.  3. Left atrial size was moderately dilated.  4. The mitral valve is normal in structure. Mild mitral valve  regurgitation. No evidence of mitral stenosis.  5. The aortic valve is normal in structure. Aortic valve regurgitation is   trivial. No aortic stenosis is present.  6. Aortic dilatation noted. There is borderline dilatation at the level  of the sinuses of Valsalva measuring 40 mm.  7. The inferior vena cava is normal in size with greater than 50%  respiratory variability, suggesting right atrial pressure of 3 mmHg.   EKG:  EKG is not ordered today. The ekg ordered today demonstrates   Recent Labs: No results found for requested labs within last 8760 hours.   Lipid Panel    Component Value Date/Time   CHOL 133 06/04/2015 0800   TRIG 84 06/04/2015 0800   HDL 39 (L) 06/04/2015 0800   CHOLHDL 3.4 06/04/2015 0800   VLDL 17 06/04/2015 0800   LDLCALC 77 06/04/2015 0800     Wt Readings from Last 3 Encounters:  07/04/19 125 lb 9.6 oz (57 kg)  07/02/19 126 lb (57.2 kg)  05/09/19 126 lb 9.6 oz (57.4 kg)     Other studies Reviewed: Additional studies/ records that were reviewed today include: . Review of the above records demonstrates:    Assessment and Plan:   1. CAD without angina/Non-ischemic cardiomyopathy: He is known to have mild to moderate CAD by cath in October 2016. LV systolic function is lower than it had been. Echo March 2021 with LVEF=40-45%. No clear ischemia on stress test. We discussed medical therapy vs cardiac cath. He is not excited about repeating a cardiac cath and I do not think it is clearly indicated. He had a recent pneumonia which may have contributed to his LV systolic dysfunction. I will stop Norvasc and start Coreg 3.25 mg po BID and Lisinopril 5 mg daily. Continue ASA and statin. Repeat echo in 3 months.   2. LBBB: Chronic.   3. Tobacco abuse, in remission: He stopped smoking in 2008. He has not restarted smoking.   4. HTN: BP is elevated. Med changes as above.     Current medicines are reviewed at length with the patient today.  The patient does not have concerns regarding medicines.  The following changes have been made:  no change  Labs/ tests ordered today  include:   Orders Placed This Encounter  Procedures  . ECHOCARDIOGRAM COMPLETE    Disposition:   FU with me in 3-44month   Signed, CLauree Chandler MD 07/04/2019 12:20 PM    [facility] [address] [place]  [address]Phone: [phone] Fax: [phone]

## 2019-07-04 NOTE — Patient Instructions (Signed)
Medication Instructions:  Your physician has recommended you make the following change in your medication:  1.) stop amlodipine 2.) start lisinopril 5 mg daily 3.) start carvedilol (Coreg) 3.25 mg twice a day  *If you need a refill on your cardiac medications before your next appointment, please call your pharmacy*   Lab Work: none If you have labs (blood work) drawn today and your tests are completely normal, you will receive your results only by: Marland Kitchen MyChart Message (if you have MyChart) OR . A paper copy in the mail If you have any lab test that is abnormal or we need to change your treatment, we will call you to review the results.   Testing/Procedures: ECHO DUE IN [place] Your physician has requested that you have an echocardiogram. Echocardiography is a painless test that uses sound waves to create images of your heart. It provides your doctor with information about the size and shape of your heart and how well your heart's chambers and valves are working. This procedure takes approximately one hour. There are no restrictions for this procedure.   Follow-Up: At [facility], you and your health needs are our priority.  As part of our continuing mission to provide you with exceptional heart care, we have created designated Provider Care Teams.  These Care Teams include your primary Cardiologist (physician) and Advanced Practice Providers (APPs -  Physician Assistants and Nurse Practitioners) who all work together to provide you with the care you need, when you need it.  We recommend signing up for the patient portal called "MyChart".  Sign up information is provided on this After Visit Summary.  MyChart is used to connect with patients for Virtual Visits (Telemedicine).  Patients are able to view lab/test results, encounter notes, upcoming appointments, etc.  Non-urgent messages can be sent to your provider as well.   To learn more about what you can do with MyChart, go to  [email or web address].    Your next appointment:   4 month(s)  The format for your next appointment:   In Person  Provider:   Verne Carrow, MD   Other Instructions

## 2019-09-04 ENCOUNTER — Other Ambulatory Visit: Payer: Self-pay

## 2019-09-04 MED ORDER — ROSUVASTATIN CALCIUM 5 MG PO TABS: ORAL_TABLET | ORAL | 2 refills | Status: DC

## 2019-09-04 MED ORDER — CARVEDILOL 3.125 MG PO TABS: 3.1250 mg | ORAL_TABLET | Freq: Two times a day (BID) | ORAL | 2 refills | Status: DC

## 2019-09-04 MED ORDER — LISINOPRIL 5 MG PO TABS: 5.0000 mg | ORAL_TABLET | Freq: Every day | ORAL | 2 refills | Status: DC

## 2019-09-24 ENCOUNTER — Ambulatory Visit (HOSPITAL_COMMUNITY): Payer: Medicare HMO | Attending: Cardiology

## 2019-09-24 ENCOUNTER — Other Ambulatory Visit: Payer: Self-pay

## 2019-09-24 DIAGNOSIS — I420 Dilated cardiomyopathy: Secondary | ICD-10-CM | POA: Diagnosis not present

## 2019-11-05 NOTE — Progress Notes (Signed)
Chief Complaint  Patient presents with  . Follow-up    cardiomyopathy   History of Present Illness: 69 yo male with history of former tobacco abuse, COPD/bronchitis, LBBB, moderate non-obstructive CAD who is here today for cardiac follow up. I met as a new patient in 2016. He was referred by Dr. Joya Gaskins in Pulmonary for further evaluation of dyspnea and abnormal EKG. He smoked 2 ppd for 40 years and quit in 2008. He c/o worsened dyspnea despite treatment for COPD. EKG in pulmonary office showed new LBBB. No chest pain. I arranged a coronary CTA which showed diffuse calcific CAD with moderate disease in the LAD and RCA. Echo overall normal. Cardiac cath 12/25/14 with moderate CAD but no focally obstructive lesions. He has done well with medical management of his CAD. Echo 05/28/19 with LVEF=40-45% with no significant valve disease. Nuclear stress test 07/02/19 with poor perfusion which may be due to prior MI. No large areas of ischemia but considered a high risk study due to LVEF=34%. Coreg and Lisinopril started in April 2021. Repeat echo July 2021 with LVEF=40-45%, unchanged. Mild AI.   He is here today for follow up. The patient denies any chest pain, dyspnea, palpitations, lower extremity edema, orthopnea, PND, dizziness, near syncope or syncope.   Primary Care Physician: Garth Bigness, NP  Past Medical History:  Diagnosis Date  . Chronic respiratory failure ([place])   . COPD (chronic obstructive pulmonary disease) ([place])   . Coronary artery disease 12/2014   a. nonobstructive CAD 12/2014.  . Emphysema (subcutaneous) (surgical) resulting from a procedure   . Emphysema of lung ([place])   . History of home oxygen therapy   . History of weight change   . LBBB (left bundle branch block)   . Sexual dysfunction   . Sneezing     Past Surgical History:  Procedure Laterality Date  . APPENDECTOMY    . CARDIAC CATHETERIZATION N/A 12/25/2014   Procedure: Left Heart Cath and Coronary  Angiography;  Surgeon: Burnell Blanks, MD;  Location: [facility];  Service: Cardiovascular;  Laterality: N/A;    Current Outpatient Medications  Medication Sig Dispense Refill  . albuterol (PROVENTIL HFA;VENTOLIN HFA) 108 (90 Base) MCG/ACT inhaler Inhale 2 puffs into the lungs every 6 (six) hours as needed for wheezing. 18 g 3  . albuterol (PROVENTIL) (2.5 MG/3ML) 0.083% nebulizer solution Take 3 mLs (2.5 mg total) by nebulization every 6 (six) hours as needed for wheezing or shortness of breath. 120 vial 3  . aspirin 81 MG tablet Take 81 mg by mouth every other day.     . budesonide-formoterol (SYMBICORT) 160-4.5 MCG/ACT inhaler Inhale 2 puffs into the lungs 2 (two) times daily. 1 Inhaler 0  . carvedilol (COREG) 3.125 MG tablet Take 1 tablet (3.125 mg total) by mouth 2 (two) times daily. 180 tablet 2  . famotidine (PEPCID) 20 MG tablet One at bedtime 30 tablet 11  . fluticasone (FLONASE) 50 MCG/ACT nasal spray Place 2 sprays into both nostrils daily as needed for allergies or rhinitis.    Marland Kitchen ibuprofen (ADVIL,MOTRIN) 200 MG tablet Take 600 mg by mouth every 6 (six) hours as needed (pain).    . Multiple Vitamin (MULTIVITAMIN) tablet Take 1 tablet by mouth daily.      . rosuvastatin (CRESTOR) 5 MG tablet TAKE 1 TABLETBY MOUTH EVERY 3 DAYS. 36 tablet 2  . losartan (COZAAR) 25 MG tablet Take 1 tablet (25 mg total) by mouth daily. 90 tablet 3  .  OXYGEN 2.5 lpm with sleep "as needed" [facility]    . Respiratory Therapy Supplies (FLUTTER) DEVI Use as directed. 1 each 0   No current facility-administered medications for this visit.    Allergies  Allergen Reactions  . Bevespi Aerosphere [Glycopyrrolate-Formoterol]     Headache and dizziness    Social History   Socioeconomic History  . Marital status: Married    Spouse name: Not on file  . Number of children: 1  . Years of education: Not on file  . Highest education level: Not on file  Occupational History  . Occupation:  Associate Professor  Tobacco Use  . Smoking status: Former Smoker    Packs/day: 2.00    Years: 40.00    Pack years: 80.00    Types: Cigarettes    Quit date: 03/14/2006    Years since quitting: 13.6  . Smokeless tobacco: Never Used  Vaping Use  . Vaping Use: Never used  Substance and Sexual Activity  . Alcohol use: No    Alcohol/week: 0.0 standard drinks  . Drug use: No  . Sexual activity: Not on file  Other Topics Concern  . Not on file  Social History Narrative   Originally from [place]. Always lived in [place]. Previously has traveled to [place], [place], [place], [place], [place], [place], [place]. No international travel. 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 No pets currently. No bird exposure. No mold exposure. No asbestos exposure.    Social Determinants of Health   Financial Resource Strain:   . Difficulty of Paying Living Expenses: Not on file  Food Insecurity:   . Worried About Charity fundraiser in the Last Year: Not on file  . Ran Out of Food in the Last Year: Not on file  Transportation Needs:   . Lack of Transportation (Medical): Not on file  . Lack of Transportation (Non-Medical): Not on file  Physical Activity:   . Days of Exercise per Week: Not on file  . Minutes of Exercise per Session: Not on file  Stress:   . Feeling of Stress : Not on file  Social Connections:   . Frequency of Communication with Friends and Family: Not on file  . Frequency of Social Gatherings with Friends and Family: Not on file  . Attends Religious Services: Not on file  . Active Member of Clubs or Organizations: Not on file  . Attends Archivist Meetings: Not on file  . Marital Status: Not on file  Intimate Partner Violence:   . Fear of Current or Ex-Partner: Not on file  . Emotionally Abused: Not on file  . Physically Abused: Not on file  . Sexually Abused: Not on file    Family History  Problem Relation Age of  Onset  . Heart disease Mother        rheumatic fever  . Rheumatic fever Mother   . Emphysema Father   . Heart attack Maternal Grandfather   . Lung cancer Paternal Uncle     Review of Systems:  As stated in the HPI and otherwise negative.   BP (!) 160/86   Pulse 68   Ht _0  (1.753 m)   Wt 132 lb (59.9 kg)   BMI 19.49 kg/m   Physical Examination:  General: Well developed, well nourished, NAD  HEENT: OP clear, mucus membranes moist  SKIN: warm, dry. No rashes. Neuro: No focal deficits  Musculoskeletal: Muscle strength 5/5 all ext  Psychiatric: Mood and affect normal  Neck: No JVD, no carotid bruits, no thyromegaly, no lymphadenopathy.  Lungs:Clear bilaterally, no wheezes, rhonci, crackles Cardiovascular: Regular rate and rhythm. No murmurs, gallops or rubs. Abdomen:Soft. Bowel sounds present. Non-tender.  Extremities: No lower extremity edema. Pulses are 2 + in the bilateral DP/PT.  Cardiac cath 12/25/14:  Prox RCA lesion, 40% stenosed.  Mid RCA to Dist RCA lesion, 40% stenosed.  RPDA lesion, 40% stenosed.  Mid Cx to Dist Cx lesion, 40% stenosed.  Prox LAD to Mid LAD lesion, 40% stenosed.  Mid LAD to Dist LAD lesion, 20% stenosed.  Ost 2nd Diag lesion, 60% stenosed.  Ost 1st Diag to 1st Diag lesion, 60% stenosed.  The left ventricular systolic function is normal.  1. Triple vessel CAD  2. Moderate non-obstructive disease in the RCA, LAD, small caliber diagonal branches and Circumflex artery. 3. Normal LV systolic function.   Echo July 2021: 1. Left ventricular ejection fraction, by estimation, is 40 to 45%. The  left ventricle has mildly decreased function. The left ventricle  demonstrates regional wall motion abnormalities (see scoring  diagram/findings for description). There is moderate  left ventricular hypertrophy of the septal segment. Left ventricular  diastolic parameters are consistent with Grade I diastolic dysfunction  (impaired relaxation).   2. Right ventricular systolic function is normal. The right ventricular  size is normal. There is normal pulmonary artery systolic pressure.  3. The mitral valve is normal in structure. Trivial mitral valve  regurgitation. No evidence of mitral stenosis.  4. The aortic valve is normal in structure. Aortic valve regurgitation is  mild. No aortic stenosis is present.  5. The inferior vena cava is normal in size with greater than 50%  respiratory variability, suggesting right atrial pressure of 3 mmHg.   Comparison(s): No significant change from prior study. Prior images  reviewed side by side.   EKG:  EKG is not ordered today. The ekg ordered today demonstrates   Recent Labs: No results found for requested labs within last 8760 hours.   Lipid Panel    Component Value Date/Time   CHOL 133 06/04/2015 0800   TRIG 84 06/04/2015 0800   HDL 39 (L) 06/04/2015 0800   CHOLHDL 3.4 06/04/2015 0800   VLDL 17 06/04/2015 0800   LDLCALC 77 06/04/2015 0800     Wt Readings from Last 3 Encounters:  11/06/19 132 lb (59.9 kg)  07/04/19 125 lb 9.6 oz (57 kg)  07/02/19 126 lb (57.2 kg)     Other studies Reviewed: Additional studies/ records that were reviewed today include: . Review of the above records demonstrates:    Assessment and Plan:   1. CAD without angina/Non-ischemic cardiomyopathy: He is known to have mild to moderate CAD by cath in October 2016. Stress test in April 2021 with no ischemia. Echo July 2021 with continued reduced LV systolic function. His cardiomyopathy is presumed to be non-ischemic. He has no chest pain.  Continue ASA, beta blocker and statin.  He did not tolerate Lisinopril due to cough. Will start Cozaar 25 mg po daily.   2. LBBB: Chronic.   3. Tobacco abuse, in remission: He stopped smoking in 2008. He has not restarted smoking.   4. HTN: BP is elevated. Will add Cozaar 25 mg daily.   Current medicines are reviewed at length with the patient today.  The  patient does not have concerns regarding medicines.  The following changes have been made:  no  change  Labs/ tests ordered today include:   No orders of the defined types were placed in this encounter.   Disposition:   FU with me in 12 months   Signed, Lauree Chandler, MD 11/06/2019 11:25 AM    [facility] [place], [place], [place]  [address] Phone: [phone]; Fax: [phone]

## 2019-11-06 ENCOUNTER — Encounter: Payer: Self-pay | Admitting: Cardiovascular Disease

## 2019-11-06 ENCOUNTER — Ambulatory Visit: Payer: Medicare HMO | Admitting: Cardiovascular Disease

## 2019-11-06 ENCOUNTER — Other Ambulatory Visit: Payer: Self-pay

## 2019-11-06 VITALS — BP 160/86 | HR 68 | Ht 69.0 in | Wt 132.0 lb

## 2019-11-06 DIAGNOSIS — I420 Dilated cardiomyopathy: Secondary | ICD-10-CM

## 2019-11-06 DIAGNOSIS — I10 Essential (primary) hypertension: Secondary | ICD-10-CM

## 2019-11-06 DIAGNOSIS — I2510 Atherosclerotic heart disease of native coronary artery without angina pectoris: Secondary | ICD-10-CM

## 2019-11-06 DIAGNOSIS — I447 Left bundle-branch block, unspecified: Secondary | ICD-10-CM | POA: Diagnosis not present

## 2019-11-06 DIAGNOSIS — Z87891 Personal history of nicotine dependence: Secondary | ICD-10-CM

## 2019-11-06 MED ORDER — LOSARTAN POTASSIUM 25 MG PO TABS: 25.0000 mg | ORAL_TABLET | Freq: Every day | ORAL | 3 refills | Status: AC

## 2019-11-06 NOTE — Patient Instructions (Addendum)
Medication Instructions:  Your physician has recommended you make the following change in your medication:  1.) start losartan (Cozaar) 25 mg - one tablet once a day  *If you need a refill on your cardiac medications before your next appointment, please call your pharmacy*   Lab Work: none If you have labs (blood work) drawn today and your tests are completely normal, you will receive your results only by: Marland Kitchen MyChart Message (if you have MyChart) OR . A paper copy in the mail If you have any lab test that is abnormal or we need to change your treatment, we will call you to review the results.   Testing/Procedures: none   Follow-Up: At [facility], you and your health needs are our priority.  As part of our continuing mission to provide you with exceptional heart care, we have created designated Provider Care Teams.  These Care Teams include your primary Cardiologist (physician) and Advanced Practice Providers (APPs -  Physician Assistants and Nurse Practitioners) who all work together to provide you with the care you need, when you need it.  We recommend signing up for the patient portal called "MyChart".  Sign up information is provided on this After Visit Summary.  MyChart is used to connect with patients for Virtual Visits (Telemedicine).  Patients are able to view lab/test results, encounter notes, upcoming appointments, etc.  Non-urgent messages can be sent to your provider as well.   To learn more about what you can do with MyChart, go to [email or web address].    Your next appointment:   6 month(s)  The format for your next appointment:   In Person  Provider:   You may see Verne Carrow, MD or one of the following Advanced Practice Providers on your designated Care Team:    Ronie Spies, PA-C  Jacolyn Reedy, PA-C    Other Instructions

## 2020-04-29 ENCOUNTER — Other Ambulatory Visit: Payer: Self-pay

## 2020-04-29 MED ORDER — ROSUVASTATIN CALCIUM 5 MG PO TABS: ORAL_TABLET | ORAL | 2 refills | Status: DC

## 2020-05-04 ENCOUNTER — Other Ambulatory Visit: Payer: Self-pay

## 2020-05-04 ENCOUNTER — Encounter: Payer: Self-pay | Admitting: Cardiovascular Disease

## 2020-05-04 ENCOUNTER — Ambulatory Visit: Payer: Medicare HMO | Admitting: Cardiovascular Disease

## 2020-05-04 VITALS — BP 132/70 | HR 62 | Ht 69.0 in | Wt 139.8 lb

## 2020-05-04 DIAGNOSIS — I2510 Atherosclerotic heart disease of native coronary artery without angina pectoris: Secondary | ICD-10-CM

## 2020-05-04 DIAGNOSIS — I10 Essential (primary) hypertension: Secondary | ICD-10-CM

## 2020-05-04 DIAGNOSIS — I428 Other cardiomyopathies: Secondary | ICD-10-CM

## 2020-05-04 DIAGNOSIS — I447 Left bundle-branch block, unspecified: Secondary | ICD-10-CM | POA: Diagnosis not present

## 2020-05-04 NOTE — Progress Notes (Signed)
Chief Complaint  Patient presents with  . Follow-up    CAD   History of Present Illness: 70 yo male with history of former tobacco abuse, COPD/bronchitis, LBBB, moderate non-obstructive CAD who is here today for cardiac follow up. I met as a new patient in 2016. He was referred by Dr. Joya Gaskins in Pulmonary for further evaluation of dyspnea and abnormal EKG. He smoked 2 ppd for 40 years and quit in 2008. He c/o worsened dyspnea despite treatment for COPD. EKG in pulmonary office showed new LBBB. No chest pain. I arranged a coronary CTA which showed diffuse calcific CAD with moderate disease in the LAD and RCA. Echo overall normal. Cardiac cath 12/25/14 with moderate CAD but no focally obstructive lesions. He has done well with medical management of his CAD. Echo 05/28/19 with LVEF=40-45% with no significant valve disease. Nuclear stress test 07/02/19 with perfusion defect which was felt to be due to prior MI but no large areas of ischemia. Coreg and Lisinopril started in April 2021. Repeat echo July 2021 with LVEF=40-45%, unchanged. Mild AI.   He is here today for follow up. The patient denies any chest pain, dyspnea, palpitations, lower extremity edema, orthopnea, PND, dizziness, near syncope or syncope.     Primary Care Physician: Garth Bigness, NP  Past Medical History:  Diagnosis Date  . Chronic respiratory failure ([place])   . COPD (chronic obstructive pulmonary disease) ([place])   . Coronary artery disease 12/2014   a. nonobstructive CAD 12/2014.  . Emphysema (subcutaneous) (surgical) resulting from a procedure   . Emphysema of lung ([place])   . History of home oxygen therapy   . History of weight change   . LBBB (left bundle branch block)   . Sexual dysfunction   . Sneezing     Past Surgical History:  Procedure Laterality Date  . APPENDECTOMY    . CARDIAC CATHETERIZATION N/A 12/25/2014   Procedure: Left Heart Cath and Coronary Angiography;  Surgeon: Burnell Blanks,  MD;  Location: [facility];  Service: Cardiovascular;  Laterality: N/A;    Current Outpatient Medications  Medication Sig Dispense Refill  . albuterol (PROVENTIL HFA;VENTOLIN HFA) 108 (90 Base) MCG/ACT inhaler Inhale 2 puffs into the lungs every 6 (six) hours as needed for wheezing. 18 g 3  . albuterol (PROVENTIL) (2.5 MG/3ML) 0.083% nebulizer solution Take 3 mLs (2.5 mg total) by nebulization every 6 (six) hours as needed for wheezing or shortness of breath. 120 vial 3  . aspirin 81 MG tablet Take 81 mg by mouth every other day.    . budesonide-formoterol (SYMBICORT) 160-4.5 MCG/ACT inhaler Inhale 2 puffs into the lungs 2 (two) times daily. 1 Inhaler 0  . carvedilol (COREG) 3.125 MG tablet Take 1 tablet (3.125 mg total) by mouth 2 (two) times daily. 180 tablet 2  . famotidine (PEPCID) 20 MG tablet One at bedtime 30 tablet 11  . fluticasone (FLONASE) 50 MCG/ACT nasal spray Place 2 sprays into both nostrils daily as needed for allergies or rhinitis.    Marland Kitchen ibuprofen (ADVIL,MOTRIN) 200 MG tablet Take 600 mg by mouth every 6 (six) hours as needed (pain).    Marland Kitchen losartan (COZAAR) 25 MG tablet Take 1 tablet (25 mg total) by mouth daily. 90 tablet 3  . Multiple Vitamin (MULTIVITAMIN) tablet Take 1 tablet by mouth daily.    . OXYGEN 2.5 lpm with sleep "as needed" [facility]    . Respiratory Therapy Supplies (FLUTTER) DEVI Use as directed. 1 each 0  .  rosuvastatin (CRESTOR) 5 MG tablet TAKE 1 TABLETBY MOUTH EVERY 3 DAYS. 36 tablet 2   No current facility-administered medications for this visit.    Allergies  Allergen Reactions  . Bevespi Aerosphere [Glycopyrrolate-Formoterol]     Headache and dizziness    Social History   Socioeconomic History  . Marital status: Married    Spouse name: Not on file  . Number of children: 1  . Years of education: Not on file  . Highest education level: Not on file  Occupational History  . Occupation: Associate Professor  Tobacco Use  . Smoking  status: Former Smoker    Packs/day: 2.00    Years: 40.00    Pack years: 80.00    Types: Cigarettes    Quit date: 03/14/2006    Years since quitting: 14.1  . Smokeless tobacco: Never Used  Vaping Use  . Vaping Use: Never used  Substance and Sexual Activity  . Alcohol use: No    Alcohol/week: 0.0 standard drinks  . Drug use: No  . Sexual activity: Not on file  Other Topics Concern  . Not on file  Social History Narrative   Originally from [place]. Always lived in [place]. Previously has traveled to [place], [place], [place], [place], [place], [place], [place]. No international travel. 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 No pets currently. No bird exposure. No mold exposure. No asbestos exposure.    Social Determinants of Health   Financial Resource Strain: Not on file  Food Insecurity: Not on file  Transportation Needs: Not on file  Physical Activity: Not on file  Stress: Not on file  Social Connections: Not on file  Intimate Partner Violence: Not on file    Family History  Problem Relation Age of Onset  . Heart disease Mother        rheumatic fever  . Rheumatic fever Mother   . Emphysema Father   . Heart attack Maternal Grandfather   . Lung cancer Paternal Uncle     Review of Systems:  As stated in the HPI and otherwise negative.   BP 132/70   Pulse 62   Ht 5' 9"  (1.753 m)   Wt 139 lb 12.8 oz (63.4 kg)   SpO2 95%   BMI 20.64 kg/m   Physical Examination:  General: Well developed, well nourished, NAD  HEENT: OP clear, mucus membranes moist  SKIN: warm, dry. No rashes. Neuro: No focal deficits  Musculoskeletal: Muscle strength 5/5 all ext  Psychiatric: Mood and affect normal  Neck: No JVD, no carotid bruits, no thyromegaly, no lymphadenopathy.  Lungs:Clear bilaterally, no wheezes, rhonci, crackles Cardiovascular: Regular rate and rhythm. No murmurs, gallops or rubs. Abdomen:Soft. Bowel sounds present.  Non-tender.  Extremities: No lower extremity edema. Pulses are 2 + in the bilateral DP/PT.  Cardiac cath 12/25/14:  Prox RCA lesion, 40% stenosed.  Mid RCA to Dist RCA lesion, 40% stenosed.  RPDA lesion, 40% stenosed.  Mid Cx to Dist Cx lesion, 40% stenosed.  Prox LAD to Mid LAD lesion, 40% stenosed.  Mid LAD to Dist LAD lesion, 20% stenosed.  Ost 2nd Diag lesion, 60% stenosed.  Ost 1st Diag to 1st Diag lesion, 60% stenosed.  The left ventricular systolic function is normal.  1. Triple vessel CAD  2. Moderate non-obstructive disease in the RCA, LAD, small caliber diagonal branches and Circumflex artery. 3. Normal LV systolic function.   Echo July 2021:  1. Left ventricular ejection fraction, by estimation, is 40 to 45%. The  left ventricle has mildly decreased function. The left ventricle  demonstrates regional wall motion abnormalities (see scoring  diagram/findings for description). There is moderate  left ventricular hypertrophy of the septal segment. Left ventricular  diastolic parameters are consistent with Grade I diastolic dysfunction  (impaired relaxation).  2. Right ventricular systolic function is normal. The right ventricular  size is normal. There is normal pulmonary artery systolic pressure.  3. The mitral valve is normal in structure. Trivial mitral valve  regurgitation. No evidence of mitral stenosis.  4. The aortic valve is normal in structure. Aortic valve regurgitation is  mild. No aortic stenosis is present.  5. The inferior vena cava is normal in size with greater than 50%  respiratory variability, suggesting right atrial pressure of 3 mmHg.   Comparison(s): No significant change from prior study. Prior images  reviewed side by side.   EKG:  EKG is ordered today. The ekg ordered today demonstrates sinus, LBBB  Recent Labs: No results found for requested labs within last 8760 hours.   Lipid Panel    Component Value Date/Time   CHOL 133  06/04/2015 0800   TRIG 84 06/04/2015 0800   HDL 39 (L) 06/04/2015 0800   CHOLHDL 3.4 06/04/2015 0800   VLDL 17 06/04/2015 0800   LDLCALC 77 06/04/2015 0800     Wt Readings from Last 3 Encounters:  05/04/20 139 lb 12.8 oz (63.4 kg)  11/06/19 132 lb (59.9 kg)  07/04/19 125 lb 9.6 oz (57 kg)     Other studies Reviewed: Additional studies/ records that were reviewed today include: . Review of the above records demonstrates:    Assessment and Plan:   1. CAD without angina/Non-ischemic cardiomyopathy: He is known to have mild to moderate CAD by cath in October 2016. Stress test in April 2021 with no ischemia. Echo July 2021 with continued reduced LV systolic function. His cardiomyopathy is presumed to be non-ischemic. No chest pain. Will continue ASA, statin and beta blocker. He had a cough on the Ace-inh. Tolerating Cozaar.  Lipids followed in primary care.   2. LBBB: Chronic.   3. Tobacco abuse, in remission: He stopped smoking in 2008. He has not restarted smoking.   4. HTN: BP is controlled. Continue current therapy  5. Aortic insufficiency: Mild by echo July 2021.   Current medicines are reviewed at length with the patient today.  The patient does not have concerns regarding medicines.  The following changes have been made:  no change  Labs/ tests ordered today include:   Orders Placed This Encounter  Procedures  . EKG 12-Lead    Disposition:   FU with me in 12 months   Signed, Lauree Chandler, MD 05/04/2020 11:20 AM    [facility] [place], [place], [place]  [address] Phone: [phone]; Fax: [phone]

## 2020-05-04 NOTE — Patient Instructions (Signed)
Medication Instructions:  °Your physician recommends that you continue on your current medications as directed. Please refer to the Current Medication list given to you today. ° °*If you need a refill on your cardiac medications before your next appointment, please call your pharmacy* ° ° °Lab Work: °none ° ° °Testing/Procedures: °none ° ° °Follow-Up: °At [facility], you and your health needs are our priority.  As part of our continuing mission to provide you with exceptional heart care, we have created designated Provider Care Teams.  These Care Teams include your primary Cardiologist (physician) and Advanced Practice Providers (APPs -  Physician Assistants and Nurse Practitioners) who all work together to provide you with the care you need, when you need it. ° °We recommend signing up for the patient portal called "MyChart".  Sign up information is provided on this After Visit Summary.  MyChart is used to connect with patients for Virtual Visits (Telemedicine).  Patients are able to view lab/test results, encounter notes, upcoming appointments, etc.  Non-urgent messages can be sent to your provider as well.   °To learn more about what you can do with MyChart, go to [email or web address].   ° °Your next appointment:   °1 year(s) ° °The format for your next appointment:   °In Person ° °Provider:   °You may see Christopher McAlhany, MD or one of the following Advanced Practice Providers on your designated Care Team:   °· Dayna Dunn, PA-C °· Michele Lenze, PA-C ° ° °

## 2020-05-27 ENCOUNTER — Other Ambulatory Visit: Payer: Self-pay | Admitting: Cardiovascular Disease

## 2021-01-20 ENCOUNTER — Other Ambulatory Visit: Payer: Self-pay | Admitting: Cardiovascular Disease
